# Patient Record
Sex: Female | Born: 1975 | Race: Black or African American | Hispanic: No | Marital: Single | Smoking: Current every day smoker
Health system: Southern US, Community
[De-identification: ages and names within clinical notes are randomized; demographics above are authoritative.]

## PROBLEM LIST (undated history)

## (undated) DIAGNOSIS — E663 Overweight: Secondary | ICD-10-CM

## (undated) DIAGNOSIS — Z923 Personal history of irradiation: Secondary | ICD-10-CM

## (undated) DIAGNOSIS — C801 Malignant (primary) neoplasm, unspecified: Secondary | ICD-10-CM

## (undated) DIAGNOSIS — I10 Essential (primary) hypertension: Secondary | ICD-10-CM

## (undated) DIAGNOSIS — C50911 Malignant neoplasm of unspecified site of right female breast: Secondary | ICD-10-CM

## (undated) DIAGNOSIS — F32A Depression, unspecified: Secondary | ICD-10-CM

## (undated) HISTORY — DX: Overweight: E66.3

## (undated) HISTORY — DX: Malignant neoplasm of unspecified site of right female breast: C50.911

## (undated) HISTORY — DX: Depression, unspecified: F32.A

## (undated) HISTORY — PX: BREAST LUMPECTOMY: SHX2

## (undated) HISTORY — DX: Essential (primary) hypertension: I10

---

## 2008-03-16 ENCOUNTER — Ambulatory Visit: Payer: Self-pay | Admitting: Diagnostic Radiology

## 2008-03-16 ENCOUNTER — Emergency Department (HOSPITAL_BASED_OUTPATIENT_CLINIC_OR_DEPARTMENT_OTHER): Admission: EM | Admit: 2008-03-16 | Discharge: 2008-03-16 | Payer: Self-pay | Admitting: Emergency Medicine

## 2008-12-19 ENCOUNTER — Emergency Department (HOSPITAL_BASED_OUTPATIENT_CLINIC_OR_DEPARTMENT_OTHER): Admission: EM | Admit: 2008-12-19 | Discharge: 2008-12-19 | Payer: Self-pay | Admitting: Emergency Medicine

## 2009-10-11 ENCOUNTER — Emergency Department (HOSPITAL_BASED_OUTPATIENT_CLINIC_OR_DEPARTMENT_OTHER): Admission: EM | Admit: 2009-10-11 | Discharge: 2009-10-11 | Payer: Self-pay | Admitting: Emergency Medicine

## 2010-09-14 ENCOUNTER — Encounter: Payer: Self-pay | Admitting: *Deleted

## 2010-09-14 ENCOUNTER — Emergency Department (HOSPITAL_BASED_OUTPATIENT_CLINIC_OR_DEPARTMENT_OTHER)
Admission: EM | Admit: 2010-09-14 | Discharge: 2010-09-14 | Disposition: A | Discharge status: Home / Self Care | Payer: Self-pay | Attending: Emergency Medicine | Admitting: Emergency Medicine

## 2010-09-14 DIAGNOSIS — F17200 Nicotine dependence, unspecified, uncomplicated: Secondary | ICD-10-CM | POA: Insufficient documentation

## 2010-09-14 DIAGNOSIS — J069 Acute upper respiratory infection, unspecified: Secondary | ICD-10-CM | POA: Insufficient documentation

## 2010-09-14 DIAGNOSIS — J029 Acute pharyngitis, unspecified: Secondary | ICD-10-CM | POA: Insufficient documentation

## 2010-09-14 DIAGNOSIS — R0982 Postnasal drip: Secondary | ICD-10-CM

## 2010-09-14 MED ORDER — OXYMETAZOLINE HCL 0.05 % NA SOLN
2.0000 | Freq: Once | NASAL | Status: AC
  Administered 2010-09-14: 2 via NASAL
  Filled 2010-09-14: qty 15

## 2010-09-14 MED ORDER — IBUPROFEN 800 MG PO TABS
800.0000 mg | ORAL_TABLET | Freq: Once | ORAL | Status: AC
  Administered 2010-09-14: 800 mg via ORAL
  Filled 2010-09-14: qty 1

## 2010-09-14 MED ORDER — BENZONATATE 100 MG PO CAPS
200.0000 mg | ORAL_CAPSULE | Freq: Once | ORAL | Status: AC
  Administered 2010-09-14: 200 mg via ORAL
  Filled 2010-09-14: qty 2

## 2010-09-14 MED ORDER — BENZONATATE 200 MG PO CAPS: 200.0000 mg | ORAL_CAPSULE | Freq: Once | ORAL | Status: AC

## 2010-09-14 MED ORDER — IBUPROFEN 800 MG PO TABS: 800.0000 mg | ORAL_TABLET | Freq: Once | ORAL | Status: AC

## 2010-09-14 NOTE — ED Notes (Signed)
C/o throat pain and her head "feels tight" and "pressure"

## 2010-09-14 NOTE — ED Provider Notes (Signed)
History     CSN: [id number] Arrival date & time: 09/14/2010 10:36 AM  Chief Complaint  Patient presents with  . Sore Throat   HPI Comments: Patient with no known sick contacts.  She has been experiencing mild headaches with pressure in her forehead area.  She denies nasal congestion.  She does endorse sensation of her ears popping and post-nasal drip.  She denies any fevers.  Patient is a 35 y.o. female presenting with pharyngitis. The history is provided by the patient. No language interpreter was used.  Sore Throat This is a new problem. The current episode started 2 days ago. The problem occurs constantly. The problem has been gradually worsening. Associated symptoms include headaches. The symptoms are aggravated by swallowing, eating, coughing and drinking. The symptoms are relieved by nothing. She has tried acetaminophen for the symptoms. The treatment provided no relief.    Past Medical History  Diagnosis Date  . Arthritis     History reviewed. No pertinent past surgical history.  History reviewed. No pertinent family history.  History  Substance Use Topics  . Smoking status: Current Everyday Smoker -- 1.0 packs/day  . Smokeless tobacco: Not on file  . Alcohol Use: Yes    OB History    Grav Para Term Preterm Abortions TAB SAB Ect Mult Living                  Review of Systems  Constitutional: Negative.   HENT: Positive for sore throat. Negative for congestion.   Eyes: Negative.   Respiratory: Positive for cough.   Cardiovascular: Negative.   Gastrointestinal: Negative.   Genitourinary: Negative.   Musculoskeletal: Positive for arthralgias.  Skin: Negative.   Neurological: Positive for headaches.  Hematological: Negative.   Psychiatric/Behavioral: Negative.   All other systems reviewed and are negative.    Physical Exam  BP 126/72  Pulse 68  Temp 98.6 F (37 C)  Resp 16  Ht 5\' 2"  (1.575 m)  Wt 152 lb (68.947 kg)  BMI 27.80 kg/m2  SpO2 99%  LMP  09/02/2010  Physical Exam  Nursing note and vitals reviewed. Constitutional: She is oriented to person, place, and time. She appears well-developed and well-nourished. No distress.  HENT:  Head: Normocephalic and atraumatic.  Right Ear: Tympanic membrane normal.  Left Ear: Tympanic membrane normal.  Nose: Mucosal edema present.  Mouth/Throat: Uvula is midline. Posterior oropharyngeal erythema present. No oropharyngeal exudate.  Eyes: Conjunctivae and EOM are normal. Pupils are equal, round, and reactive to light.  Neck: Normal range of motion.  Cardiovascular: Normal rate, regular rhythm, normal heart sounds and intact distal pulses.  Exam reveals no gallop and no friction rub.   No murmur heard. Pulmonary/Chest: Effort normal and breath sounds normal. No respiratory distress. She has no wheezes. She has no rales.  Abdominal: Soft. Bowel sounds are normal. She exhibits no distension. There is no tenderness. There is no rebound and no guarding.  Musculoskeletal: Normal range of motion.  Lymphadenopathy:    She has cervical adenopathy.  Neurological: She is alert and oriented to person, place, and time. No cranial nerve deficit. She exhibits normal muscle tone. Coordination normal.  Skin: Skin is warm and dry. No rash noted.  Psychiatric: She has a normal mood and affect.    ED Course  Procedures  MDM Patient had benign appearance and presentation consistent with viral URI vs. Allergic symptoms.  Patient received Afrin which improved her headache.  She was given tessalon perles for her cough and  advised to gargle with warm salt water for her sore throat.  PAtient stated understanding and was discharged home in good condition.  A: 35 yo F with mild URI symptoms.   Plan: As per MDM above.      Cyndra Numbers, MD 09/14/10 2053

## 2010-10-09 ENCOUNTER — Emergency Department (HOSPITAL_BASED_OUTPATIENT_CLINIC_OR_DEPARTMENT_OTHER)
Admission: EM | Admit: 2010-10-09 | Discharge: 2010-10-09 | Disposition: A | Discharge status: Home / Self Care | Payer: Self-pay | Attending: Emergency Medicine | Admitting: Emergency Medicine

## 2010-10-09 ENCOUNTER — Encounter (HOSPITAL_BASED_OUTPATIENT_CLINIC_OR_DEPARTMENT_OTHER): Payer: Self-pay | Admitting: *Deleted

## 2010-10-09 DIAGNOSIS — R51 Headache: Secondary | ICD-10-CM | POA: Insufficient documentation

## 2010-10-09 DIAGNOSIS — IMO0001 Reserved for inherently not codable concepts without codable children: Secondary | ICD-10-CM

## 2010-10-09 DIAGNOSIS — K219 Gastro-esophageal reflux disease without esophagitis: Secondary | ICD-10-CM | POA: Insufficient documentation

## 2010-10-09 DIAGNOSIS — J45909 Unspecified asthma, uncomplicated: Secondary | ICD-10-CM | POA: Insufficient documentation

## 2010-10-09 DIAGNOSIS — J029 Acute pharyngitis, unspecified: Secondary | ICD-10-CM | POA: Insufficient documentation

## 2010-10-09 LAB — RAPID STREP SCREEN (MED CTR MEBANE ONLY): Streptococcus, Group A Screen (Direct): NEGATIVE

## 2010-10-09 MED ORDER — METOCLOPRAMIDE HCL 5 MG/ML IJ SOLN
10.0000 mg | Freq: Once | INTRAMUSCULAR | Status: AC
  Administered 2010-10-09: 10 mg via INTRAMUSCULAR
  Filled 2010-10-09: qty 2

## 2010-10-09 MED ORDER — BUTALBITAL-APAP-CAFFEINE 50-325-40 MG PO TABS: 1.0000 | ORAL_TABLET | Freq: Four times a day (QID) | ORAL | Status: AC | PRN

## 2010-10-09 MED ORDER — IBUPROFEN 800 MG PO TABS
ORAL_TABLET | ORAL | Status: AC
  Filled 2010-10-09: qty 1

## 2010-10-09 MED ORDER — DIPHENHYDRAMINE HCL 50 MG PO CAPS
50.0000 mg | ORAL_CAPSULE | Freq: Once | ORAL | Status: AC
  Administered 2010-10-09: 50 mg via ORAL
  Filled 2010-10-09: qty 1

## 2010-10-09 MED ORDER — FAMOTIDINE 20 MG PO TABS: 20.0000 mg | ORAL_TABLET | Freq: Two times a day (BID) | ORAL | Status: DC

## 2010-10-09 MED ORDER — IBUPROFEN 800 MG PO TABS
800.0000 mg | ORAL_TABLET | Freq: Once | ORAL | Status: AC
  Administered 2010-10-09: 800 mg via ORAL

## 2010-10-09 MED ORDER — DIPHENHYDRAMINE HCL 25 MG PO CAPS
ORAL_CAPSULE | ORAL | Status: AC
  Administered 2010-10-09: 50 mg via ORAL
  Filled 2010-10-09: qty 2

## 2010-10-09 MED ORDER — GI COCKTAIL ~~LOC~~
30.0000 mL | Freq: Once | ORAL | Status: AC
  Administered 2010-10-09: 30 mL via ORAL
  Filled 2010-10-09: qty 30

## 2010-10-09 NOTE — ED Notes (Signed)
Pt states that she has no cold just a sore throat and headache. Pt states that she has some acid reflux issues.

## 2010-10-09 NOTE — ED Notes (Signed)
Facial pain, sore throat, and headache on and off x 1 week.

## 2010-10-12 NOTE — ED Provider Notes (Signed)
History     CSN: [id number] Arrival date & time: 10/09/2010  9:45 PM  Chief Complaint  Patient presents with  . URI    (Consider location/radiation/quality/duration/timing/severity/associated sxs/prior treatment) HPI Comments: Patient has had no rash.  She thinks her headache might be a migraine as it has persisted for days.  Patient has pain that is a 5-6/10.    URI  This is a recurrent problem. The current episode started yesterday. The problem has been unchanged. There has been no fever. Associated symptoms include congestion, headaches, a sore throat and swollen glands. She has tried acetaminophen (ASA) for the symptoms. The treatment provided no relief.    Past Medical History  Diagnosis Date  . Asthma     History reviewed. No pertinent past surgical history.  No family history on file.  History  Substance Use Topics  . Smoking status: Current Everyday Smoker -- 1.0 packs/day  . Smokeless tobacco: Not on file  . Alcohol Use: Yes    OB History    Grav Para Term Preterm Abortions TAB SAB Ect Mult Living                  Review of Systems  HENT: Positive for congestion and sore throat.   Neurological: Positive for headaches.  All other systems reviewed and are negative.    Allergies  Review of patient's allergies indicates no known allergies.  Home Medications   Current Outpatient Rx  Name Route Sig Dispense Refill  . RANITIDINE HCL 75 MG PO TABS Oral Take 75 mg by mouth 2 (two) times daily.      Marland Kitchen BUTALBITAL-APAP-CAFFEINE 50-325-40 MG PO TABS Oral Take 1-2 tablets by mouth every 6 (six) hours as needed for headache. 20 tablet 0  . FAMOTIDINE 20 MG PO TABS Oral Take 1 tablet (20 mg total) by mouth 2 (two) times daily. 30 tablet 0    BP 132/72  Pulse 74  Temp(Src) 98 F (36.7 C) (Oral)  Resp 17  SpO2 100%  LMP 09/02/2010  Physical Exam  Nursing note and vitals reviewed. Constitutional: She is oriented to person, place, and time. She appears  well-developed and well-nourished. No distress.  HENT:  Head: Normocephalic and atraumatic.  Right Ear: External ear normal.  Left Ear: External ear normal.  Mouth/Throat: Uvula is midline and mucous membranes are normal. Posterior oropharyngeal erythema present. No oropharyngeal exudate.  Eyes: Conjunctivae and EOM are normal. Pupils are equal, round, and reactive to light.  Neck: Normal range of motion.  Cardiovascular: Normal rate, regular rhythm, normal heart sounds and intact distal pulses.  Exam reveals no gallop and no friction rub.   No murmur heard. Pulmonary/Chest: Effort normal and breath sounds normal. No respiratory distress. She has no wheezes. She has no rales.  Abdominal: Soft. Bowel sounds are normal. She exhibits no distension. There is no tenderness. There is no rebound and no guarding.  Musculoskeletal: Normal range of motion. She exhibits no edema.  Lymphadenopathy:    She has cervical adenopathy.       Right cervical: Superficial cervical adenopathy present.       Left cervical: Superficial cervical adenopathy present.  Neurological: She is alert and oriented to person, place, and time. No cranial nerve deficit. She exhibits normal muscle tone. Coordination normal.  Skin: Skin is warm and dry. No rash noted. No erythema.  Psychiatric: She has a normal mood and affect.    ED Course  Procedures (including critical care time)   Labs Reviewed  RAPID STREP SCREEN   No results found.   1. Headache   2. Sore throat   3. Reflux       MDM  Patient was evaluated and was given ibuprofen initially.  She had persistent headache and IM shots of reglan and oral benadryl were given with some improvement.  Patient was also given a GI cocktail she also had complained of her sore throat being related to heartburn later in the visit.  Patient felt better.  She was given prescriptions for fioricet and pepcid.  She was encouraged to follow-up with a PCP and was discharged home  in good condition.      Cyndra Numbers, MD 10/12/10 [phone]

## 2018-01-07 DIAGNOSIS — C50911 Malignant neoplasm of unspecified site of right female breast: Secondary | ICD-10-CM

## 2018-01-07 DIAGNOSIS — C801 Malignant (primary) neoplasm, unspecified: Secondary | ICD-10-CM

## 2018-01-07 HISTORY — DX: Malignant (primary) neoplasm, unspecified: C80.1

## 2018-01-07 HISTORY — DX: Malignant neoplasm of unspecified site of right female breast: C50.911

## 2018-01-07 HISTORY — PX: BREAST LUMPECTOMY: SHX2

## 2018-02-19 DIAGNOSIS — K219 Gastro-esophageal reflux disease without esophagitis: Secondary | ICD-10-CM | POA: Insufficient documentation

## 2018-02-23 DIAGNOSIS — D0511 Intraductal carcinoma in situ of right breast: Secondary | ICD-10-CM | POA: Insufficient documentation

## 2018-04-01 DIAGNOSIS — F419 Anxiety disorder, unspecified: Secondary | ICD-10-CM | POA: Insufficient documentation

## 2018-04-01 DIAGNOSIS — F32A Depression, unspecified: Secondary | ICD-10-CM | POA: Insufficient documentation

## 2019-10-08 HISTORY — PX: CARPAL TUNNEL RELEASE: SHX101

## 2019-10-25 DIAGNOSIS — G5601 Carpal tunnel syndrome, right upper limb: Secondary | ICD-10-CM | POA: Insufficient documentation

## 2019-12-08 DIAGNOSIS — U071 COVID-19: Secondary | ICD-10-CM | POA: Insufficient documentation

## 2019-12-08 HISTORY — DX: COVID-19: U07.1

## 2019-12-29 ENCOUNTER — Encounter (HOSPITAL_BASED_OUTPATIENT_CLINIC_OR_DEPARTMENT_OTHER): Payer: Self-pay | Admitting: Emergency Medicine

## 2019-12-29 ENCOUNTER — Other Ambulatory Visit: Payer: Self-pay

## 2019-12-29 ENCOUNTER — Emergency Department (HOSPITAL_BASED_OUTPATIENT_CLINIC_OR_DEPARTMENT_OTHER)
Admission: EM | Admit: 2019-12-29 | Discharge: 2019-12-29 | Disposition: A | Discharge status: Home / Self Care | Payer: HRSA Program | Attending: Emergency Medicine | Admitting: Emergency Medicine

## 2019-12-29 DIAGNOSIS — M791 Myalgia, unspecified site: Secondary | ICD-10-CM | POA: Insufficient documentation

## 2019-12-29 DIAGNOSIS — F17200 Nicotine dependence, unspecified, uncomplicated: Secondary | ICD-10-CM | POA: Insufficient documentation

## 2019-12-29 DIAGNOSIS — U071 COVID-19: Secondary | ICD-10-CM | POA: Insufficient documentation

## 2019-12-29 DIAGNOSIS — J45909 Unspecified asthma, uncomplicated: Secondary | ICD-10-CM | POA: Diagnosis not present

## 2019-12-29 DIAGNOSIS — Z859 Personal history of malignant neoplasm, unspecified: Secondary | ICD-10-CM | POA: Diagnosis not present

## 2019-12-29 DIAGNOSIS — R070 Pain in throat: Secondary | ICD-10-CM | POA: Diagnosis present

## 2019-12-29 DIAGNOSIS — B349 Viral infection, unspecified: Secondary | ICD-10-CM

## 2019-12-29 HISTORY — DX: Malignant (primary) neoplasm, unspecified: C80.1

## 2019-12-29 LAB — RESP PANEL BY RT-PCR (FLU A&B, COVID) ARPGX2
Influenza A by PCR: NEGATIVE
Influenza B by PCR: NEGATIVE
SARS Coronavirus 2 by RT PCR: POSITIVE — AB

## 2019-12-29 MED ORDER — IBUPROFEN 400 MG PO TABS
600.0000 mg | ORAL_TABLET | Freq: Once | ORAL | Status: AC
  Administered 2019-12-29: 600 mg via ORAL
  Filled 2019-12-29: qty 1

## 2019-12-29 NOTE — ED Provider Notes (Signed)
[facility] EMER[facility]ENC[facility] DEPARTMENT Provider Note   CSN: [id number] Arrival date & time: 12/29/19  1925     History Chief Complaint  Patient presents with   Generalized Body Aches    Evelyn Cantrell is a 44 y.o. female.  HPI 44 year old female presents with body aches.  Yesterday started noticing bilateral thigh aching as well as in her hands.  No fevers and no other signs of illness such as cough, rhinorrhea, sore throat, shortness of breath, vomiting, diarrhea.  Has a mild headache.  Took some TheraFlu.  Has been vaccinated versus COVID.  Her son has similar symptoms but also a sore throat. No discolored urine. Is not on a statin.   Past Medical History:  Diagnosis Date   Asthma    Cancer ([place])     There are no problems to display for this patient.   History reviewed. No pertinent surgical history.   OB History   No obstetric history on file.     No family history on file.  Social History   Tobacco Use   Smoking status: Current Every Day Smoker    Packs/day: 1.00  Substance Use Topics   Alcohol use: Yes   Drug use: No    Home Medications Prior to Admission medications   Medication Sig Start Date End Date Taking? Authorizing Provider  tamoxifen (NOLVADEX) 20 MG tablet  10/10/19  Yes [provider]  famotidine (PEPCID) 20 MG tablet Take 1 tablet (20 mg total) by mouth 2 (two) times daily. 10/09/10 10/09/11  Chauncy Passy, MD  ranitidine (ZANTAC) 75 MG tablet Take 75 mg by mouth 2 (two) times daily.      [provider]    Allergies    Patient has no known allergies.  Review of Systems   Review of Systems  Constitutional: Negative for fever.  HENT: Negative for congestion, rhinorrhea and sore throat.   Respiratory: Negative for cough and shortness of breath.   Gastrointestinal: Negative for diarrhea and vomiting.  Genitourinary: Negative for dysuria.  Musculoskeletal: Positive for myalgias.  Neurological: Positive for  headaches.  All other systems reviewed and are negative.   Physical Exam Updated Vital Signs BP (!) 125/93    Pulse (!) 108    Temp 99.1 F (37.3 C) (Oral)    Resp 16    Ht 5\' 2"  (1.575 m)    Wt 67.1 kg    SpO2 96%    BMI 27.07 kg/m   Physical Exam Vitals and nursing note reviewed.  Constitutional:      General: She is not in acute distress.    Appearance: She is well-developed and well-nourished. She is not ill-appearing or diaphoretic.  HENT:     Head: Normocephalic and atraumatic.     Right Ear: External ear normal.     Left Ear: External ear normal.     Nose: Nose normal.  Eyes:     General:        Right eye: No discharge.        Left eye: No discharge.  Cardiovascular:     Rate and Rhythm: Normal rate and regular rhythm.     Heart sounds: Normal heart sounds.  Pulmonary:     Effort: Pulmonary effort is normal.     Breath sounds: Normal breath sounds.  Abdominal:     Palpations: Abdomen is soft.     Tenderness: There is no abdominal tenderness.  Musculoskeletal:     Comments: No muscle tenderness in large muscle  groups like quadriceps  Skin:    General: Skin is warm and dry.  Neurological:     Mental Status: She is alert.  Psychiatric:        Mood and Affect: Mood is not anxious.     ED Results / Procedures / Treatments   Labs (all labs ordered are listed, but only abnormal results are displayed) Labs Reviewed  RESP PANEL BY RT-PCR (FLU A&B, COVID) ARPGX2    EKG None  Radiology No results found.  Procedures Procedures (including critical care time)  Medications Ordered in ED Medications  ibuprofen (ADVIL) tablet 600 mg (has no administration in time range)    ED Course  I have reviewed the triage vital signs and the nursing notes.  Pertinent labs & imaging results that were available during my care of the patient were reviewed by me and considered in my medical decision making (see chart for details).    MDM Rules/Calculators/A&P                           With her son also feeling poorly I suspect this is a viral illness.  Will test for Covid, which was ordered and swabbed in triage.  However given her well appearance she does not appear to need to stay for the results and can follow-up in my chart.  Highly doubt bacterial illness at this time.  Highly doubt rhabdomyolysis.  Discharged home with NSAIDs, Tylenol, return precautions.  Evelyn Cantrell was evaluated in Emergency Department on 12/29/2019 for the symptoms described in the history of present illness. She was evaluated in the context of the global COVID-19 pandemic, which necessitated consideration that the patient might be at risk for infection with the SARS-CoV-2 virus that causes COVID-19. Institutional protocols and algorithms that pertain to the evaluation of patients at risk for COVID-19 are in a state of rapid change based on information released by regulatory bodies including the CDC and federal and state organizations. These policies and algorithms were followed during the patient's care in the ED.  Final Clinical Impression(s) / ED Diagnoses Final diagnoses:  Myalgia  Viral syndrome    Rx / DC Orders ED Discharge Orders    None       Sherwood Gambler, MD 12/29/19 2029

## 2019-12-29 NOTE — Discharge Instructions (Signed)
If you develop high fever, severe cough or cough with blood, trouble breathing, severe headache, neck pain/stiffness, vomiting, or any other new/concerning symptoms then return to the ER for evaluation  

## 2019-12-29 NOTE — ED Triage Notes (Signed)
pt c/o generalized boyd aches since yesterday denies any URI sympotms.

## 2019-12-30 ENCOUNTER — Encounter: Payer: Self-pay | Admitting: Nurse Practitioner

## 2019-12-30 ENCOUNTER — Telehealth: Payer: Self-pay | Admitting: Nurse Practitioner

## 2019-12-30 DIAGNOSIS — C801 Malignant (primary) neoplasm, unspecified: Secondary | ICD-10-CM | POA: Insufficient documentation

## 2019-12-30 DIAGNOSIS — E663 Overweight: Secondary | ICD-10-CM | POA: Insufficient documentation

## 2019-12-30 DIAGNOSIS — J45909 Unspecified asthma, uncomplicated: Secondary | ICD-10-CM | POA: Insufficient documentation

## 2019-12-30 NOTE — Telephone Encounter (Signed)
I called Evelyn Cantrell to discuss Covid symptoms and the use of Sotrovimab, a monoclonal antibody infusion for those with mild to moderate Covid symptoms and at a high risk of hospitalization.     Pt is fully vaccinated with limited symptoms and therefor will not qualify for mAb given current supply limitations.  Symptoms reviewed that would warrant ED/Hospital evaluation. Preventative practices reviewed. Patient verbalized understanding. Patient advised to call back if he decides that he does want to get infusion. Callback number to the infusion center given. Patient advised to go to Urgent care or ED with severe symptoms.    Patient Active Problem List   Diagnosis Date Noted  . Cancer ([place])   . Asthma   . Overweight (BMI 25.0-29.9)   . COVID-19 virus infection 12/2019    Murray Hodgkins, NP

## 2020-02-18 LAB — RESULTS CONSOLE HPV: CHL HPV: NEGATIVE

## 2020-02-18 LAB — HM PAP SMEAR

## 2020-08-15 ENCOUNTER — Emergency Department (HOSPITAL_BASED_OUTPATIENT_CLINIC_OR_DEPARTMENT_OTHER)
Admission: EM | Admit: 2020-08-15 | Discharge: 2020-08-15 | Disposition: A | Discharge status: Home / Self Care | Payer: Medicaid Other | Attending: Emergency Medicine | Admitting: Emergency Medicine

## 2020-08-15 ENCOUNTER — Emergency Department (HOSPITAL_BASED_OUTPATIENT_CLINIC_OR_DEPARTMENT_OTHER): Payer: Medicaid Other

## 2020-08-15 ENCOUNTER — Other Ambulatory Visit: Payer: Self-pay

## 2020-08-15 ENCOUNTER — Encounter (HOSPITAL_BASED_OUTPATIENT_CLINIC_OR_DEPARTMENT_OTHER): Payer: Self-pay | Admitting: *Deleted

## 2020-08-15 DIAGNOSIS — Z8616 Personal history of COVID-19: Secondary | ICD-10-CM | POA: Insufficient documentation

## 2020-08-15 DIAGNOSIS — F17210 Nicotine dependence, cigarettes, uncomplicated: Secondary | ICD-10-CM | POA: Insufficient documentation

## 2020-08-15 DIAGNOSIS — M5413 Radiculopathy, cervicothoracic region: Secondary | ICD-10-CM

## 2020-08-15 DIAGNOSIS — Z859 Personal history of malignant neoplasm, unspecified: Secondary | ICD-10-CM | POA: Insufficient documentation

## 2020-08-15 DIAGNOSIS — M5412 Radiculopathy, cervical region: Secondary | ICD-10-CM | POA: Insufficient documentation

## 2020-08-15 MED ORDER — METHOCARBAMOL 500 MG PO TABS: 500.0000 mg | ORAL_TABLET | Freq: Two times a day (BID) | ORAL | 0 refills | Status: DC

## 2020-08-15 MED ORDER — LIDOCAINE 5 % EX PTCH
1.0000 | MEDICATED_PATCH | CUTANEOUS | Status: DC
  Administered 2020-08-15: 1 via TRANSDERMAL
  Filled 2020-08-15: qty 1

## 2020-08-15 NOTE — ED Triage Notes (Signed)
Right shoulder pain x 3 weeks. No known injury. Started after lifting at work.

## 2020-08-15 NOTE — ED Notes (Signed)
Pt. Reports R shoulder pain hurting for a long time and burning for 3 weeks.  Pt. In no distress and reports no shob.  Pt.is able to move the R arm and has no trouble with chest distress or cough.  History of no injury.

## 2020-08-15 NOTE — ED Provider Notes (Signed)
[facility] EM[facility] DEPART[facility] Provider Note   CSN: [id number] Arrival date & time: 08/15/20  1607     History Chief Complaint  Patient presents with   Shoulder Pain    Evelyn Cantrell is a 45 y.o. female.  45 year old female with complaint of a burning pain in her right shoulder/medial boarder of the scapula for the past few weeks. Pain lasts a few seconds and completely resolved between episodes. Nothing makes pain better or worse. Reports prior breast cancer with mastectomy 2 years ago, no ongoing treatment.       Past Medical History:  Diagnosis Date   Asthma    Cancer ([place])    COVID-19 virus infection 12/2019   Overweight (BMI 25.0-29.9)     Patient Active Problem List   Diagnosis Date Noted   Cancer ([place])    Asthma    Overweight (BMI 25.0-29.9)    COVID-19 virus infection 12/2019    History reviewed. No pertinent surgical history.   OB History   No obstetric history on file.     No family history on file.  Social History   Tobacco Use   Smoking status: Every Day    Packs/day: 1.00    Types: Cigarettes   Smokeless tobacco: Never  Vaping Use   Vaping Use: Never used  Substance Use Topics   Alcohol use: Yes   Drug use: No    Home Medications Prior to Admission medications   Medication Sig Start Date End Date Taking? Authorizing Provider  methocarbamol (ROBAXIN) 500 MG tablet Take 1 tablet (500 mg total) by mouth 2 (two) times daily. 08/15/20  Yes Tacy Learn, PA-C  tamoxifen (NOLVADEX) 20 MG tablet  10/10/19  Yes [provider]  famotidine (PEPCID) 20 MG tablet Take 1 tablet (20 mg total) by mouth 2 (two) times daily. 10/09/10 10/09/11  Chauncy Passy, MD  ranitidine (ZANTAC) 75 MG tablet Take 75 mg by mouth 2 (two) times daily.      [provider]    Allergies    Patient has no known allergies.  Review of Systems   Review of Systems  Constitutional:  Negative for fever.  Musculoskeletal:  Positive for back pain.  Negative for neck pain and neck stiffness.  Skin:  Negative for rash and wound.  Neurological:  Negative for weakness and numbness.  Hematological:  Negative for adenopathy.  Psychiatric/Behavioral:  Negative for confusion.    Physical Exam Updated Vital Signs BP 140/90   Pulse 65   Temp 98.8 F (37.1 C) (Oral)   Resp 18   Ht '5\' 2"'$  (1.575 m)   Wt 67.1 kg   SpO2 100%   BMI 27.06 kg/m   Physical Exam Vitals and nursing note reviewed.  Constitutional:      General: She is not in acute distress.    Appearance: She is well-developed. She is not diaphoretic.  HENT:     Head: Normocephalic and atraumatic.  Cardiovascular:     Rate and Rhythm: Normal rate and regular rhythm.     Pulses: Normal pulses.     Heart sounds: Normal heart sounds.  Pulmonary:     Effort: Pulmonary effort is normal.     Breath sounds: Normal breath sounds.  Musculoskeletal:        General: Tenderness present. No swelling or deformity. Normal range of motion.     Comments: Ttp right trapezius into medial border right scapula with palpable spasm   Skin:    General:  Skin is warm and dry.     Findings: No erythema or rash.  Neurological:     Mental Status: She is alert and oriented to person, place, and time.     Sensory: No sensory deficit.     Motor: No weakness.  Psychiatric:        Behavior: Behavior normal.    ED Results / Procedures / Treatments   Labs (all labs ordered are listed, but only abnormal results are displayed) Labs Reviewed - No data to display  EKG None  Radiology DG Shoulder Right  Result Date: 08/15/2020 CLINICAL DATA:  Shoulder pain EXAM: RIGHT SHOULDER - 2+ VIEW COMPARISON:  None. FINDINGS: There is no evidence of fracture or dislocation. There is no evidence of arthropathy or other focal bone abnormality. Soft tissues are unremarkable. IMPRESSION: Negative. Electronically Signed   By: Donavan Foil M.D.   On: 08/15/2020 18:53    Procedures Procedures   Medications  Ordered in ED Medications  lidocaine (LIDODERM) 5 % 1 patch (1 patch Transdermal Patch Applied 08/15/20 1955)    ED Course  I have reviewed the triage vital signs and the nursing notes.  Pertinent labs & imaging results that were available during my care of the patient were reviewed by me and considered in my medical decision making (see chart for details).  Clinical Course as of 08/15/20 2023  Tue Aug 15, 4749  6211 45 year old female with burning sensation along medial border right scapula for the past few weeks. Right shoulder pain for the past year (prior work up MRI cervical spine and shoulder eval unremarkable 1 year ago).  Found to have palpable muscle spasm right trapezius which reproduces pain. Plan is for muscle relaxant, warm compresses, gentle stretching and recheck with PCP.  [LM]  2023 XR shoulder unremarkable.  [LM]    Clinical Course User Index [LM] Roque Lias   MDM Rules/Calculators/A&P                           Final Clinical Impression(s) / ED Diagnoses Final diagnoses:  Radiculopathy of cervicothoracic region    Rx / DC Orders ED Discharge Orders          Ordered    methocarbamol (ROBAXIN) 500 MG tablet  2 times daily        08/15/20 1948             Tacy Learn, PA-C 08/15/20 2023    Isla Pence, MD 08/15/20 2121

## 2020-08-15 NOTE — Discharge Instructions (Addendum)
Take Robaxin as prescribed as needed.  Recommend warm compresses followed by gentle stretching. Monitor your blood pressure, record readings, follow-up with your primary care provider.

## 2020-09-24 ENCOUNTER — Emergency Department (HOSPITAL_BASED_OUTPATIENT_CLINIC_OR_DEPARTMENT_OTHER): Payer: Self-pay

## 2020-09-24 ENCOUNTER — Other Ambulatory Visit: Payer: Self-pay

## 2020-09-24 ENCOUNTER — Emergency Department (HOSPITAL_BASED_OUTPATIENT_CLINIC_OR_DEPARTMENT_OTHER)
Admission: EM | Admit: 2020-09-24 | Discharge: 2020-09-24 | Disposition: A | Discharge status: Home / Self Care | Payer: Self-pay | Attending: Emergency Medicine | Admitting: Emergency Medicine

## 2020-09-24 ENCOUNTER — Encounter (HOSPITAL_BASED_OUTPATIENT_CLINIC_OR_DEPARTMENT_OTHER): Payer: Self-pay | Admitting: *Deleted

## 2020-09-24 DIAGNOSIS — Z79810 Long term (current) use of selective estrogen receptor modulators (SERMs): Secondary | ICD-10-CM | POA: Insufficient documentation

## 2020-09-24 DIAGNOSIS — R42 Dizziness and giddiness: Secondary | ICD-10-CM | POA: Insufficient documentation

## 2020-09-24 DIAGNOSIS — F17210 Nicotine dependence, cigarettes, uncomplicated: Secondary | ICD-10-CM | POA: Insufficient documentation

## 2020-09-24 DIAGNOSIS — R079 Chest pain, unspecified: Secondary | ICD-10-CM | POA: Insufficient documentation

## 2020-09-24 DIAGNOSIS — Z8616 Personal history of COVID-19: Secondary | ICD-10-CM | POA: Insufficient documentation

## 2020-09-24 DIAGNOSIS — J45909 Unspecified asthma, uncomplicated: Secondary | ICD-10-CM | POA: Insufficient documentation

## 2020-09-24 DIAGNOSIS — Z853 Personal history of malignant neoplasm of breast: Secondary | ICD-10-CM | POA: Insufficient documentation

## 2020-09-24 LAB — CBC
HCT: 42.9 % (ref 36.0–46.0)
Hemoglobin: 14.4 g/dL (ref 12.0–15.0)
MCH: 32.4 pg (ref 26.0–34.0)
MCHC: 33.6 g/dL (ref 30.0–36.0)
MCV: 96.6 fL (ref 80.0–100.0)
Platelets: 224 10*3/uL (ref 150–400)
RBC: 4.44 MIL/uL (ref 3.87–5.11)
RDW: 14.2 % (ref 11.5–15.5)
WBC: 5.7 10*3/uL (ref 4.0–10.5)
nRBC: 0 % (ref 0.0–0.2)

## 2020-09-24 LAB — BASIC METABOLIC PANEL
Anion gap: 7 (ref 5–15)
BUN: 7 mg/dL (ref 6–20)
CO2: 28 mmol/L (ref 22–32)
Calcium: 9.3 mg/dL (ref 8.9–10.3)
Chloride: 104 mmol/L (ref 98–111)
Creatinine, Ser: 0.93 mg/dL (ref 0.44–1.00)
GFR, Estimated: >60 mL/min (ref >60)
Glucose, Bld: 93 mg/dL (ref 70–99)
Potassium: 3.9 mmol/L (ref 3.5–5.1)
Sodium: 139 mmol/L (ref 135–145)

## 2020-09-24 LAB — PREGNANCY, URINE: Preg Test, Ur: NEGATIVE

## 2020-09-24 LAB — TROPONIN I (HIGH SENSITIVITY): Troponin I (High Sensitivity): 4 ng/L (ref <18)

## 2020-09-24 NOTE — ED Triage Notes (Signed)
Intermittent left chest pain and dizziness for a week.

## 2020-09-24 NOTE — Discharge Instructions (Addendum)
Found no emergent cause of chest pain while you are in the emergency department.  Your chest pain could be caused by acid reflux, musculoskeletal pain, or other unknown cause.  You should follow-up with [facility].  Please return to the emergency department if you develop worsening symptoms such as shortness of breath, leg swelling, or worsening chest pain.

## 2020-09-24 NOTE — ED Provider Notes (Signed)
[facility] EMERGENCY DEPARTMENT Provider Note   CSN: [id number] Arrival date & time: 09/24/20  1727     History Chief Complaint  Patient presents with   Dizziness   Chest Pain    Evelyn Cantrell is a 45 y.o. female.  Medical history of asthma and history of breast cancer that was treated with lumpectomy and 2020 currently on tamoxifen, and tobacco use.  Presents with intermittent left-sided chest pain for the past week.  Describes it as pressure.  It does not radiate.  Episodes last about a minute and then improved.  Is nonpleuritic.  It starts at random times no associated soon with rest or exertion.  This is never happened before.  She has not tried anything to help the pain.  She denies injury to the area.  She denies any syncope, shortness of breath, nausea, vomiting, leg swelling.  The history is provided by the patient.  Dizziness Associated symptoms: chest pain   Chest Pain Pain location:  L chest Pain quality: tightness   Pain quality: not radiating   Pain radiates to:  Does not radiate Pain severity:  Moderate Onset quality:  Sudden Duration:  6 days Timing:  Intermittent Progression:  Unchanged Chronicity:  New Context: at rest   Context: not breathing, not drug use, not eating, not movement and not trauma   Relieved by:  Nothing Worsened by:  Nothing Ineffective treatments:  None tried     Past Medical History:  Diagnosis Date   Asthma    Cancer ([place])    COVID-19 virus infection 12/2019   Overweight (BMI 25.0-29.9)     Patient Active Problem List   Diagnosis Date Noted   Cancer ([place])    Asthma    Overweight (BMI 25.0-29.9)    COVID-19 virus infection 12/2019    History reviewed. No pertinent surgical history.   OB History   No obstetric history on file.     History reviewed. No pertinent family history.  Social History   Tobacco Use   Smoking status: Every Day    Packs/day: 1.00    Types: Cigarettes   Smokeless tobacco: Never   Vaping Use   Vaping Use: Never used  Substance Use Topics   Alcohol use: Yes   Drug use: No    Home Medications Prior to Admission medications   Medication Sig Start Date End Date Taking? Authorizing Provider  tamoxifen (NOLVADEX) 20 MG tablet  10/10/19  Yes [provider]    Allergies    Patient has no known allergies.  Review of Systems   Review of Systems  Cardiovascular:  Positive for chest pain.   Physical Exam Updated Vital Signs BP (!) 125/91 (BP Location: Left Arm)   Pulse 75   Temp 98.5 F (36.9 C) (Oral)   Resp 18   Ht '5\' 2"'$  (1.575 m)   Wt 64.4 kg   SpO2 100%   BMI 25.97 kg/m   Physical Exam  ED Results / Procedures / Treatments   Labs (all labs ordered are listed, but only abnormal results are displayed) Labs Reviewed  BASIC METABOLIC PANEL  CBC  PREGNANCY, URINE  TROPONIN I (HIGH SENSITIVITY)  TROPONIN I (HIGH SENSITIVITY)    EKG EKG Interpretation  Date/Time:  Sunday September 24 2020 17:48:37 EDT Ventricular Rate:  83 PR Interval:  176 QRS Duration: 68 QT Interval:  354 QTC Calculation: 415 R Axis:   72 Text Interpretation: Normal sinus rhythm Biatrial enlargement No previous tracing Confirmed by Maryan Rued,  [place] [id number]) on 09/24/2020 6:20:01 PM  Radiology DG Chest 2 View  Result Date: 09/24/2020 CLINICAL DATA:  Chest pain EXAM: CHEST - 2 VIEW COMPARISON:  03/16/2008 FINDINGS: The heart size and mediastinal contours are within normal limits. Both lungs are clear. The visualized skeletal structures are unremarkable. IMPRESSION: No active cardiopulmonary disease. Electronically Signed   By: Merilyn Baba M.D.   On: 09/24/2020 19:02    Procedures Procedures   Medications Ordered in ED Medications - No data to display  ED Course  I have reviewed the triage vital signs and the nursing notes.  Pertinent labs & imaging results that were available during my care of the patient were reviewed by me and considered in my medical  decision making (see chart for details).    MDM Rules/Calculators/A&P                         Is a well-appearing 45 year old female presents to the emergency department with complaints of chest pain.  Pain is described to be intermittent and for about 1 week.  She has no significant cardiac history.  She is afebrile and vital signs are overall unrevealing.  Chest pain described is atypical for ACS.  MP with no electrolyte disturbances or kidney dysfunction.  CBC with no anemia or leukocytosis.  Troponin is 4.  Chest x-ray with no acute abnormalities. Heart score is 1 PERC 1 WELLS = low risk No indication for CT chest due to low risk Wells score  Patient was informed of these results and findings.  She is recommended to get established at [facility].  She is instructed to return to the emergency department if she develops any worsening symptoms.  Final Clinical Impression(s) / ED Diagnoses Final diagnoses:  Chest pain, unspecified type    Rx / DC Orders ED Discharge Orders     None        Adolphus Birchwood, PA-C 09/25/20 0012    Blanchie Dessert, MD 09/26/20 1339

## 2020-09-24 NOTE — ED Notes (Signed)
IV attempt left AC unsuccessful, patient tolerated well, blood obtained and sent to lab.

## 2020-10-13 DIAGNOSIS — E663 Overweight: Secondary | ICD-10-CM | POA: Diagnosis not present

## 2020-10-13 DIAGNOSIS — Z01419 Encounter for gynecological examination (general) (routine) without abnormal findings: Secondary | ICD-10-CM | POA: Diagnosis not present

## 2020-10-13 DIAGNOSIS — Z113 Encounter for screening for infections with a predominantly sexual mode of transmission: Secondary | ICD-10-CM | POA: Diagnosis not present

## 2020-10-13 DIAGNOSIS — Z1388 Encounter for screening for disorder due to exposure to contaminants: Secondary | ICD-10-CM | POA: Diagnosis not present

## 2020-10-13 DIAGNOSIS — Z0389 Encounter for observation for other suspected diseases and conditions ruled out: Secondary | ICD-10-CM | POA: Diagnosis not present

## 2020-10-13 DIAGNOSIS — Z853 Personal history of malignant neoplasm of breast: Secondary | ICD-10-CM | POA: Diagnosis not present

## 2020-10-13 DIAGNOSIS — Z3009 Encounter for other general counseling and advice on contraception: Secondary | ICD-10-CM | POA: Diagnosis not present

## 2020-11-21 ENCOUNTER — Emergency Department (HOSPITAL_BASED_OUTPATIENT_CLINIC_OR_DEPARTMENT_OTHER)
Admission: EM | Admit: 2020-11-21 | Discharge: 2020-11-22 | Disposition: A | Discharge status: Home / Self Care | Payer: Self-pay | Attending: Emergency Medicine | Admitting: Emergency Medicine

## 2020-11-21 ENCOUNTER — Other Ambulatory Visit: Payer: Self-pay

## 2020-11-21 ENCOUNTER — Emergency Department (HOSPITAL_BASED_OUTPATIENT_CLINIC_OR_DEPARTMENT_OTHER): Payer: Self-pay

## 2020-11-21 ENCOUNTER — Encounter (HOSPITAL_BASED_OUTPATIENT_CLINIC_OR_DEPARTMENT_OTHER): Payer: Self-pay | Admitting: Emergency Medicine

## 2020-11-21 DIAGNOSIS — M79602 Pain in left arm: Secondary | ICD-10-CM | POA: Insufficient documentation

## 2020-11-21 DIAGNOSIS — R072 Precordial pain: Secondary | ICD-10-CM | POA: Insufficient documentation

## 2020-11-21 DIAGNOSIS — R0602 Shortness of breath: Secondary | ICD-10-CM | POA: Insufficient documentation

## 2020-11-21 DIAGNOSIS — F17210 Nicotine dependence, cigarettes, uncomplicated: Secondary | ICD-10-CM | POA: Insufficient documentation

## 2020-11-21 DIAGNOSIS — Z8616 Personal history of COVID-19: Secondary | ICD-10-CM | POA: Insufficient documentation

## 2020-11-21 DIAGNOSIS — J45909 Unspecified asthma, uncomplicated: Secondary | ICD-10-CM | POA: Insufficient documentation

## 2020-11-21 DIAGNOSIS — R42 Dizziness and giddiness: Secondary | ICD-10-CM | POA: Insufficient documentation

## 2020-11-21 DIAGNOSIS — Z853 Personal history of malignant neoplasm of breast: Secondary | ICD-10-CM | POA: Insufficient documentation

## 2020-11-21 LAB — CBC
HCT: 39.9 % (ref 36.0–46.0)
Hemoglobin: 13.5 g/dL (ref 12.0–15.0)
MCH: 32 pg (ref 26.0–34.0)
MCHC: 33.8 g/dL (ref 30.0–36.0)
MCV: 94.5 fL (ref 80.0–100.0)
Platelets: 210 10*3/uL (ref 150–400)
RBC: 4.22 MIL/uL (ref 3.87–5.11)
RDW: 13.6 % (ref 11.5–15.5)
WBC: 7.8 10*3/uL (ref 4.0–10.5)
nRBC: 0 % (ref 0.0–0.2)

## 2020-11-21 LAB — BASIC METABOLIC PANEL
Anion gap: 8 (ref 5–15)
BUN: 7 mg/dL (ref 6–20)
CO2: 24 mmol/L (ref 22–32)
Calcium: 8.9 mg/dL (ref 8.9–10.3)
Chloride: 103 mmol/L (ref 98–111)
Creatinine, Ser: 0.82 mg/dL (ref 0.44–1.00)
GFR, Estimated: >60 mL/min (ref >60)
Glucose, Bld: 95 mg/dL (ref 70–99)
Potassium: 3.4 mmol/L — ABNORMAL LOW (ref 3.5–5.1)
Sodium: 135 mmol/L (ref 135–145)

## 2020-11-21 LAB — TROPONIN I (HIGH SENSITIVITY): Troponin I (High Sensitivity): <2 ng/L (ref <18)

## 2020-11-21 LAB — PREGNANCY, URINE: Preg Test, Ur: NEGATIVE

## 2020-11-21 NOTE — ED Triage Notes (Signed)
Pt states was in bed and started having left arm pain followed by chest pain and SOB about 1 hour ago

## 2020-11-21 NOTE — ED Provider Notes (Signed)
[facility] EMERGENCY DEPARTMENT Provider Note   CSN: [id number] Arrival date & time: 11/21/20  2027     History Chief Complaint  Patient presents with   Arm Pain   Chest Pain   Shortness of Breath    Evelyn Cantrell is a 45 y.o. female.  HPI     Was home in bed, had a pain in the left arm, then had chest pain and shortness of breath  Started right before arrival 8PM Feeling a little better right now Lasted about 2 hours Sharp pain, worse with deep breaths , not worse with exertion Nausea, no vomiting No fevers, cough, abdominal pain Lightheaded all day today, no syncope  Uncle who had a heart attack, when older than 55, no immediate fam hx  Smoking cigarettes, no etoh or other drugs  Hx of breast cancer, lumpectomy, on tamoxifen, asthma hx, not wheezing   No leg pain or swelling, no hx of DVT or PE  Past Medical History:  Diagnosis Date   Asthma    Cancer ([place])    COVID-19 virus infection 12/2019   Overweight (BMI 25.0-29.9)     Patient Active Problem List   Diagnosis Date Noted   Cancer ([place])    Asthma    Overweight (BMI 25.0-29.9)    COVID-19 virus infection 12/2019    Past Surgical History:  Procedure Laterality Date   BREAST LUMPECTOMY Right      OB History   No obstetric history on file.     History reviewed. No pertinent family history.  Social History   Tobacco Use   Smoking status: Every Day    Packs/day: 1.00    Types: Cigarettes   Smokeless tobacco: Never  Vaping Use   Vaping Use: Never used  Substance Use Topics   Alcohol use: Yes   Drug use: No    Home Medications Prior to Admission medications   Medication Sig Start Date End Date Taking? Authorizing Provider  tamoxifen (NOLVADEX) 20 MG tablet  10/10/19   [provider]    Allergies    Patient has no known allergies.  Review of Systems   Review of Systems  Constitutional:  Negative for fever.  HENT:  Negative for sore throat.   Eyes:  Negative  for visual disturbance.  Respiratory:  Positive for shortness of breath. Negative for cough.   Cardiovascular:  Positive for chest pain.  Gastrointestinal:  Negative for abdominal pain.  Genitourinary:  Negative for difficulty urinating.  Musculoskeletal:  Negative for back pain and neck pain.  Skin:  Negative for rash.  Neurological:  Positive for light-headedness. Negative for syncope and headaches.   Physical Exam Updated Vital Signs BP 115/77   Pulse 82   Temp 98.2 F (36.8 C) (Oral)   Resp 16   Ht 5\' 2"  (1.575 m)   Wt 66.2 kg   LMP 08/13/2020   SpO2 100%   BMI 26.70 kg/m   Physical Exam Vitals and nursing note reviewed.  Constitutional:      General: She is not in acute distress.    Appearance: She is well-developed. She is not diaphoretic.  HENT:     Head: Normocephalic and atraumatic.  Eyes:     Conjunctiva/sclera: Conjunctivae normal.  Cardiovascular:     Rate and Rhythm: Normal rate and regular rhythm.     Heart sounds: Normal heart sounds. No murmur heard.   No friction rub. No gallop.  Pulmonary:     Effort: Pulmonary effort is normal.  No respiratory distress.     Breath sounds: Normal breath sounds. No wheezing or rales.  Abdominal:     General: There is no distension.     Palpations: Abdomen is soft.     Tenderness: There is no abdominal tenderness. There is no guarding.  Musculoskeletal:        General: No tenderness.     Cervical back: Normal range of motion.  Skin:    General: Skin is warm and dry.     Findings: No erythema or rash.  Neurological:     Mental Status: She is alert and oriented to person, place, and time.    ED Results / Procedures / Treatments   Labs (all labs ordered are listed, but only abnormal results are displayed) Labs Reviewed  BASIC METABOLIC PANEL - Abnormal; Notable for the following components:      Result Value   Potassium 3.4 (*)    All other components within normal limits  CBC  PREGNANCY, URINE  D-DIMER,  QUANTITATIVE  TROPONIN I (HIGH SENSITIVITY)  TROPONIN I (HIGH SENSITIVITY)    EKG EKG Interpretation  Date/Time:  Tuesday November 21 2020 20:40:29 EST Ventricular Rate:  100 PR Interval:  152 QRS Duration: 64 QT Interval:  316 QTC Calculation: 407 R Axis:   78 Text Interpretation: Normal sinus rhythm Normal ECG No significant change since last tracing Confirmed by Gareth Morgan [phone]) on 11/21/2020 11:00:16 PM  Radiology DG Chest 2 View  Result Date: 11/21/2020 CLINICAL DATA:  Chest pain and shortness of breath. EXAM: CHEST - 2 VIEW COMPARISON:  September 24, 2020 FINDINGS: The heart size and mediastinal contours are within normal limits. Both lungs are clear. The visualized skeletal structures are unremarkable. IMPRESSION: No active cardiopulmonary disease. Electronically Signed   By: Virgina Norfolk M.D.   On: 11/21/2020 21:01    Procedures Procedures   Medications Ordered in ED Medications - No data to display  ED Course  I have reviewed the triage vital signs and the nursing notes.  Pertinent labs & imaging results that were available during my care of the patient were reviewed by me and considered in my medical decision making (see chart for details).    MDM Rules/Calculators/A&P                           45yo female with history of asthma, breast cancer, presents with concern for chest pain and shortness of breath.   Differential diagnosis for chest pain includes pulmonary embolus, dissection, pneumothorax, pneumonia, ACS, myocarditis, pericarditis.  EKG was done and evaluate by me and showed no acute ST changes and no signs of pericarditis. Chest x-ray was done and evaluated by me and radiology and showed no sign of pneumonia or pneumothorax.  Patient is low risk HEART score initial troponin negative.  Do not feel history or exam are consistent with aortic dissection.  DDimer and troponin pending at time of transfer of care.       Final Clinical  Impression(s) / ED Diagnoses Final diagnoses:  Precordial pain    Rx / DC Orders ED Discharge Orders     None        Gareth Morgan, MD 11/22/20 2210

## 2020-11-22 LAB — D-DIMER, QUANTITATIVE: D-Dimer, Quant: 0.27 ug/mL-FEU (ref 0.00–0.50)

## 2020-11-22 LAB — TROPONIN I (HIGH SENSITIVITY): Troponin I (High Sensitivity): <2 ng/L (ref <18)

## 2021-03-18 ENCOUNTER — Emergency Department (HOSPITAL_BASED_OUTPATIENT_CLINIC_OR_DEPARTMENT_OTHER)
Admission: EM | Admit: 2021-03-18 | Discharge: 2021-03-18 | Disposition: A | Discharge status: Home / Self Care | Payer: 59 | Attending: Emergency Medicine | Admitting: Emergency Medicine

## 2021-03-18 ENCOUNTER — Other Ambulatory Visit: Payer: Self-pay

## 2021-03-18 ENCOUNTER — Emergency Department (HOSPITAL_BASED_OUTPATIENT_CLINIC_OR_DEPARTMENT_OTHER): Payer: 59

## 2021-03-18 ENCOUNTER — Encounter (HOSPITAL_BASED_OUTPATIENT_CLINIC_OR_DEPARTMENT_OTHER): Payer: Self-pay | Admitting: Emergency Medicine

## 2021-03-18 DIAGNOSIS — R059 Cough, unspecified: Secondary | ICD-10-CM | POA: Diagnosis present

## 2021-03-18 DIAGNOSIS — J40 Bronchitis, not specified as acute or chronic: Secondary | ICD-10-CM | POA: Insufficient documentation

## 2021-03-18 DIAGNOSIS — Z20822 Contact with and (suspected) exposure to covid-19: Secondary | ICD-10-CM | POA: Insufficient documentation

## 2021-03-18 DIAGNOSIS — Z853 Personal history of malignant neoplasm of breast: Secondary | ICD-10-CM | POA: Diagnosis not present

## 2021-03-18 DIAGNOSIS — R42 Dizziness and giddiness: Secondary | ICD-10-CM | POA: Insufficient documentation

## 2021-03-18 DIAGNOSIS — J45909 Unspecified asthma, uncomplicated: Secondary | ICD-10-CM | POA: Insufficient documentation

## 2021-03-18 LAB — RESP PANEL BY RT-PCR (FLU A&B, COVID) ARPGX2
Influenza A by PCR: NEGATIVE
Influenza B by PCR: NEGATIVE
SARS Coronavirus 2 by RT PCR: NEGATIVE

## 2021-03-18 MED ORDER — BENZONATATE 100 MG PO CAPS: 100.0000 mg | ORAL_CAPSULE | Freq: Three times a day (TID) | ORAL | 0 refills | Status: DC

## 2021-03-18 MED ORDER — ALBUTEROL SULFATE HFA 108 (90 BASE) MCG/ACT IN AERS: 1.0000 | INHALATION_SPRAY | Freq: Four times a day (QID) | RESPIRATORY_TRACT | 0 refills | Status: DC | PRN

## 2021-03-18 NOTE — ED Triage Notes (Signed)
Pt arrives pov with c/o HA, cough and head and chest congestion x 1 week ?

## 2021-03-18 NOTE — ED Provider Notes (Addendum)
?[facility] EMERGENCY DEPARTMENT ?Provider Note ? ? ?CSN: [id number] ?Arrival date & time: 03/18/21  1509 ? ?  ? ?History ? ?Chief Complaint  ?Patient presents with  ? Cough  ? ? ?Evelyn Cantrell is a 46 y.o. female with medical history of asthma, breast cancer in remission.  Patient presents to ED for evaluation of cough.  Patient states that last Saturday she began having sore throat at this time began taking medicine to include Tylenol Sinus and cold, Mucinex, TheraFlu.  Patient states that this alleviated symptoms momentarily however symptoms always seem to return.  Patient states that now she is experiencing a bad cough and recently experienced a fever on Monday and Tuesday.  Patient denies any recent fever in the last 48 hours.  Patient states that she no longer has a sore throat but has felt lightheaded the last 2 days on exertion.  Patient states that she has had no appetite beginning of the week however her appetite is returned.  Patient denies any known sick contacts.  Patient states she is vaccinated against COVID-19 but not for influenza.  Patient states that she is in remission in respect to her breast cancer, her last radiation treatment was over 2 years ago.  Patient endorsing lightheadedness, wheezing, cough, fever, sore throat.  Patient denies nausea, vomiting, diarrhea, weakness, syncope, chest pain. ? ? ? ? ? ?Cough ?Associated symptoms: fever, sore throat and wheezing   ?Associated symptoms: no chest pain and no shortness of breath   ? ?  ? ?Home Medications ?Prior to Admission medications   ?Medication Sig Start Date End Date Taking? Authorizing Provider  ?benzonatate (TESSALON) 100 MG capsule Take 1 capsule (100 mg total) by mouth every 8 (eight) hours. 03/18/21  Yes Azucena Cecil, PA-C  ?tamoxifen (NOLVADEX) 20 MG tablet  10/10/19   [provider]  ?   ? ?Allergies    ?Patient has no known allergies.   ? ?Review of Systems   ?Review of Systems  ?Constitutional:  Positive  for fever.  ?HENT:  Positive for sore throat.   ?Respiratory:  Positive for cough and wheezing. Negative for shortness of breath.   ?Cardiovascular:  Negative for chest pain.  ?Gastrointestinal:  Negative for diarrhea, nausea and vomiting.  ?Neurological:  Positive for light-headedness. Negative for syncope and weakness.  ?All other systems reviewed and are negative. ? ?Physical Exam ?Updated Vital Signs ?BP (!) 135/91   Pulse 80   Temp 98.4 ?F (36.9 ?C) (Oral)   Resp 18   Ht '5\' 2"'$  (1.575 m)   Wt 66.7 kg   LMP 03/06/2021   SpO2 100%   BMI 26.89 kg/m?  ?Physical Exam ?Vitals and nursing note reviewed.  ?Constitutional:   ?   General: She is not in acute distress. ?   Appearance: Normal appearance. She is not ill-appearing, toxic-appearing or diaphoretic.  ?HENT:  ?   Head: Normocephalic and atraumatic.  ?   Nose: Nose normal. No congestion.  ?   Mouth/Throat:  ?   Mouth: Mucous membranes are moist.  ?   Pharynx: Oropharynx is clear. Posterior oropharyngeal erythema present.  ?Eyes:  ?   Extraocular Movements: Extraocular movements intact.  ?   Conjunctiva/sclera: Conjunctivae normal.  ?   Pupils: Pupils are equal, round, and reactive to light.  ?Cardiovascular:  ?   Rate and Rhythm: Normal rate and regular rhythm.  ?Pulmonary:  ?   Effort: Pulmonary effort is normal.  ?   Breath sounds: Normal breath  sounds. No stridor. No wheezing, rhonchi or rales.  ?Abdominal:  ?   General: Abdomen is flat. Bowel sounds are normal.  ?   Palpations: Abdomen is soft.  ?Musculoskeletal:     ?   General: Normal range of motion.  ?   Cervical back: Normal range of motion and neck supple. No tenderness.  ?Skin: ?   General: Skin is warm and dry.  ?   Capillary Refill: Capillary refill takes less than 2 seconds.  ?Neurological:  ?   Mental Status: She is alert and oriented to person, place, and time.  ? ? ?ED Results / Procedures / Treatments   ?Labs ?(all labs ordered are listed, but only abnormal results are displayed) ?Labs  Reviewed  ?RESP PANEL BY RT-PCR (FLU A&B, COVID) ARPGX2  ? ? ?EKG ?None ? ?Radiology ?DG Chest 2 View ? ?Result Date: 03/18/2021 ?CLINICAL DATA:  Headache, cough, head congestion, chest congestion for 1 week, recent fever, smoker, asthma EXAM: CHEST - 2 VIEW COMPARISON:  11/21/2020 FINDINGS: Normal heart size, mediastinal contours, and pulmonary vascularity. Minimal peribronchial thickening, chronic, could be related to asthma, bronchitis, or smoking. Lungs clear. No pleural effusion or pneumothorax. Bones unremarkable. IMPRESSION: Mild peribronchial thickening question bronchitis, asthma or related to smoking. No acute infiltrate. Electronically Signed   By: Lavonia Dana M.D.   On: 03/18/2021 17:14   ? ?Procedures ?Procedures  ? ? ?Medications Ordered in ED ?Medications - No data to display ? ?ED Course/ Medical Decision Making/ A&P ?  ?                        ?Medical Decision Making ?Amount and/or Complexity of Data Reviewed ?Radiology: ordered. ? ?Risk ?Prescription drug management. ? ? ?46 year old female presents ED for evaluation of cough.  Please see HPI for further details. ?On examination, patient is afebrile, nontachycardic, not hypoxic.  The patient has clear lung sounds bilaterally.  Patient abdomen soft and compressible in all 4 quadrants.  Patient posterior oropharynx has erythema however no exudate. ? ?Patient worked up utilizing following labs imaging studies personally interpreted by me: ?- Respiratory panel negative for all ?- Chest x-ray shows possible signs of bronchitis ? ?At this time, most likely cause of patient symptoms is due to bronchitis.  I advised the patient to decrease or completely stop smoking cigarettes.  I have encouraged the patient to follow-up with her PCP for any ongoing needs.  I will provide the patient with prescription for [facility], provide return precautions.  Patient albuterol inhaler also refilled at her request.  Patient voiced understanding return precautions.   Patient had all of her questions answered to her satisfaction.  Patient stable at this time for discharge. ? ?Final Clinical Impression(s) / ED Diagnoses ?Final diagnoses:  ?Bronchitis  ? ? ?Rx / DC Orders ?ED Discharge Orders   ? ?      Ordered  ?  benzonatate (TESSALON) 100 MG capsule  Every 8 hours       ? 03/18/21 1646  ? ?  ?  ? ?  ? ? ?  ? ? ?  ?Azucena Cecil, PA-C ?03/18/21 1729 ? ?  ?Blanchie Dessert, MD ?03/18/21 2159 ? ?

## 2021-03-18 NOTE — Discharge Instructions (Addendum)
Please return to the ED with any new symptoms such as shortness of breath, chest pain ?Please pick up your prescription medication sent in for you. ?Please attempt to lessen the amount of cigarettes you smoke over the next couple of days.  I believe this is contributing to your symptoms. ?Please review the attached informational guidance concerning bronchitis that included ?Please follow-up with your PCP for any ongoing needs ?

## 2021-07-16 ENCOUNTER — Other Ambulatory Visit: Payer: 59

## 2021-07-16 ENCOUNTER — Ambulatory Visit: Payer: 59 | Admitting: Hematology & Oncology

## 2021-07-25 ENCOUNTER — Encounter: Payer: Self-pay | Admitting: Hematology & Oncology

## 2021-07-25 ENCOUNTER — Inpatient Hospital Stay (HOSPITAL_BASED_OUTPATIENT_CLINIC_OR_DEPARTMENT_OTHER): Payer: 59 | Admitting: Hematology & Oncology

## 2021-07-25 ENCOUNTER — Inpatient Hospital Stay: Payer: 59 | Attending: Pain Medicine

## 2021-07-25 VITALS — BP 135/95 | HR 74 | Temp 98.4°F | Resp 20 | Ht 62.0 in | Wt 144.8 lb

## 2021-07-25 DIAGNOSIS — C50011 Malignant neoplasm of nipple and areola, right female breast: Secondary | ICD-10-CM

## 2021-07-25 DIAGNOSIS — Z79810 Long term (current) use of selective estrogen receptor modulators (SERMs): Secondary | ICD-10-CM | POA: Diagnosis not present

## 2021-07-25 DIAGNOSIS — Z79899 Other long term (current) drug therapy: Secondary | ICD-10-CM

## 2021-07-25 DIAGNOSIS — C801 Malignant (primary) neoplasm, unspecified: Secondary | ICD-10-CM

## 2021-07-25 DIAGNOSIS — C50911 Malignant neoplasm of unspecified site of right female breast: Secondary | ICD-10-CM

## 2021-07-25 DIAGNOSIS — Z923 Personal history of irradiation: Secondary | ICD-10-CM

## 2021-07-25 DIAGNOSIS — Z170 Estrogen receptor positive status [ER+]: Secondary | ICD-10-CM | POA: Diagnosis not present

## 2021-07-25 DIAGNOSIS — F17210 Nicotine dependence, cigarettes, uncomplicated: Secondary | ICD-10-CM | POA: Insufficient documentation

## 2021-07-25 LAB — CMP (CANCER CENTER ONLY)
ALT: 7 U/L (ref 0–44)
AST: 13 U/L — ABNORMAL LOW (ref 15–41)
Albumin: 4.2 g/dL (ref 3.5–5.0)
Alkaline Phosphatase: 51 U/L (ref 38–126)
Anion gap: 6 (ref 5–15)
BUN: 9 mg/dL (ref 6–20)
CO2: 28 mmol/L (ref 22–32)
Calcium: 9.5 mg/dL (ref 8.9–10.3)
Chloride: 106 mmol/L (ref 98–111)
Creatinine: 0.99 mg/dL (ref 0.44–1.00)
GFR, Estimated: >60 mL/min (ref >60)
Glucose, Bld: 88 mg/dL (ref 70–99)
Potassium: 4.1 mmol/L (ref 3.5–5.1)
Sodium: 140 mmol/L (ref 135–145)
Total Bilirubin: 0.5 mg/dL (ref 0.3–1.2)
Total Protein: 7.1 g/dL (ref 6.5–8.1)

## 2021-07-25 LAB — CBC WITH DIFFERENTIAL (CANCER CENTER ONLY)
Abs Immature Granulocytes: 0.01 10*3/uL (ref 0.00–0.07)
Basophils Absolute: 0 10*3/uL (ref 0.0–0.1)
Basophils Relative: 1 %
Eosinophils Absolute: 0.1 10*3/uL (ref 0.0–0.5)
Eosinophils Relative: 2 %
HCT: 38.5 % (ref 36.0–46.0)
Hemoglobin: 12.9 g/dL (ref 12.0–15.0)
Immature Granulocytes: 0 %
Lymphocytes Relative: 50 %
Lymphs Abs: 2.8 10*3/uL (ref 0.7–4.0)
MCH: 32.7 pg (ref 26.0–34.0)
MCHC: 33.5 g/dL (ref 30.0–36.0)
MCV: 97.7 fL (ref 80.0–100.0)
Monocytes Absolute: 0.4 10*3/uL (ref 0.1–1.0)
Monocytes Relative: 8 %
Neutro Abs: 2.2 10*3/uL (ref 1.7–7.7)
Neutrophils Relative %: 39 %
Platelet Count: 213 10*3/uL (ref 150–400)
RBC: 3.94 MIL/uL (ref 3.87–5.11)
RDW: 13.5 % (ref 11.5–15.5)
WBC Count: 5.6 10*3/uL (ref 4.0–10.5)
nRBC: 0 % (ref 0.0–0.2)

## 2021-07-25 LAB — LACTATE DEHYDROGENASE: LDH: 175 U/L (ref 98–192)

## 2021-07-25 LAB — VITAMIN D 25 HYDROXY (VIT D DEFICIENCY, FRACTURES): Vit D, 25-Hydroxy: 25.83 ng/mL — ABNORMAL LOW (ref 30–100)

## 2021-07-25 MED ORDER — NICOTINE 21 MG/24HR TD PT24
21.0000 mg | MEDICATED_PATCH | Freq: Every day | TRANSDERMAL | 0 refills | Status: DC
Start: 2021-07-25 — End: 2021-10-26

## 2021-07-25 NOTE — Progress Notes (Signed)
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  : I need a cancer doctor.  HPI: Evelyn Cantrell is a very charming 46 year old premenopausal African-American female.  She is originally from [place].  She moved down to [place] to help take care of her grandmother.  While she was down there, she was found to have a lump in her right breast.  She subsequently underwent an evaluation which confirmed that she had breast cancer.  She underwent a lumpectomy on 02/23/2018.  At the time, she was found to have a stage IA (T1aN0M0) invasive ductal carcinoma.  She had a tumor is moderately differentiated.  It is 5 mm.  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  She had a sentinel lymph node that was negative.  She had Oncotype testing of 3.  She underwent adjuvant radiation therapy.  She did well with this.  Patient was then placed on tamoxifen.  She subsequently has moved back up to [place].  She needs an oncologist.  She does have other health issues.  She been having some pain in the right breast and right arm.  I think she is seeing neurology for this.  She has 1 son.  She had him when she was 47 years old.  She said that she stopped having her monthly cycles with starting tamoxifen.  She been off it for a little bit and then her cycle restarted.  She now is back on tamoxifen.  She has had no family history of breast cancer on her mother side of the family.  She was not genetically tested.  She was negative for the BRCA gene.  She has a past history of tobacco use.  She still smokes.  She would like to stop smoking.  She has rare alcohol use.  She has had no change in bowel or bladder habits.  She has had no nausea or vomiting.  She has had no leg swelling.  She has had little bit  of swelling in the right arm.  Overall, I would say her performance status is probably ECOG 1.    Past Medical History:  Diagnosis Date   Asthma    Cancer ([place])    COVID-19 virus infection 12/2019   Overweight (BMI 25.0-29.9)   :   Past Surgical History:  Procedure Laterality Date   BREAST LUMPECTOMY Right   :   Current Outpatient Medications:    albuterol (VENTOLIN HFA) 108 (90 Base) MCG/ACT inhaler, Inhale 1-2 puffs into the lungs every 6 (six) hours as needed for wheezing or shortness of breath., Disp: 18 g, Rfl: 0   citalopram (CELEXA) 10 MG tablet, Take by mouth., Disp: , Rfl:    fluticasone (FLONASE) 50 MCG/ACT nasal spray, Place into the nose., Disp: , Rfl:    hydrOXYzine (ATARAX) 25 MG tablet, Take 25 mg by mouth daily as needed., Disp: , Rfl:    tamoxifen (NOLVADEX) 20 MG tablet, 20 mg daily., Disp: , Rfl:    zolpidem (AMBIEN) 10 MG tablet, Take 10 mg by mouth at bedtime., Disp: , Rfl:    omeprazole (PRILOSEC) 20 MG capsule, Take 20 mg by mouth daily. (Patient not taking: Reported on 07/25/2021), Disp: , Rfl: :  :  Not on File:  No family history on  file.:   Social History   Socioeconomic History   Marital status: Single    Spouse name: Not on file   Number of children: Not on file   Years of education: Not on file   Highest education level: Not on file  Occupational History   Not on file  Tobacco Use   Smoking status: Every Day    Packs/day: 0.50    Types: Cigarettes   Smokeless tobacco: Never  Vaping Use   Vaping Use: Never used  Substance and Sexual Activity   Alcohol use: Yes    Comment: occasional wine.   Drug use: No   Sexual activity: Not Currently  Other Topics Concern   Not on file  Social History Narrative   Not on file   Social Determinants of Health   Financial Resource Strain: Not on file  Food Insecurity: Not on file  Transportation Needs: Not on file  Physical Activity: Not on file  Stress: Not on file  Social Connections:  Not on file  Intimate Partner Violence: Not on file  :  Review of Systems  Constitutional: Negative.   HENT: Negative.    Eyes: Negative.   Respiratory: Negative.    Cardiovascular: Negative.   Gastrointestinal: Negative.   Genitourinary: Negative.   Musculoskeletal:  Positive for joint pain and myalgias.  Skin:  Positive for rash.  Neurological: Negative.   Endo/Heme/Allergies: Negative.   Psychiatric/Behavioral: Negative.       Exam: Her vital signs show temperature of 98.4.  Pulse 74.  Blood pressure 135/95.  Weight is 144 pounds. _0 @ Physical Exam Vitals reviewed.  Constitutional:      Comments: Her breast exam shows left breast no masses, edema or erythema.  There is no left axillary adenopathy.  Her right breast shows the lumpectomy at about the 5 o'clock position.  She has a well-healed surgical scar.  She has no areola swelling.  There is no obvious mass in the right breast.  There is no right axillary adenopathy.  HENT:     Head: Normocephalic and atraumatic.  Eyes:     Pupils: Pupils are equal, round, and reactive to light.  Cardiovascular:     Rate and Rhythm: Normal rate and regular rhythm.     Heart sounds: Normal heart sounds.  Pulmonary:     Effort: Pulmonary effort is normal.     Breath sounds: Normal breath sounds.  Abdominal:     General: Bowel sounds are normal.     Palpations: Abdomen is soft.  Musculoskeletal:        General: No tenderness or deformity. Normal range of motion.     Cervical back: Normal range of motion.  Lymphadenopathy:     Cervical: No cervical adenopathy.  Skin:    General: Skin is warm and dry.     Findings: No erythema or rash.     Comments: On her back, she has a very large birthmark.  Neurological:     Mental Status: She is alert and oriented to person, place, and time.  Psychiatric:        Behavior: Behavior normal.        Thought Content: Thought content normal.        Judgment: Judgment normal.     Recent  Labs    07/25/21 1412  WBC 5.6  HGB 12.9  HCT 38.5  PLT 213    Recent Labs    07/25/21 1412  NA 140  K 4.1  CL 106  CO2 28  GLUCOSE 88  BUN 9  CREATININE 0.99  CALCIUM 9.5    Blood smear review: None  Pathology: See above    Assessment and Plan: Evelyn Cantrell is a very charming 46 year old African-American.  She has a early stage-stage IA -ductal carcinoma of the right breast.  She has incredibly low Oncotype score of 3.  She has had lumpectomy.  She has had radiation therapy.  She is on tamoxifen.  I think her risk of recurrence is easily less than 10%.  I would think the risk of recurrence is probably less than 5%.  I we will keep her on tamoxifen.  I probably think she needs tamoxifen probably for 5 years.  I am not sure that further tamoxifen after 5 years would be of benefit for her.  She will need her mammogram and or MRI.  We will have to see how we need to get this all set up.  I do not see that we have to switch her over to an aromatase inhibitor.  We will see what her menopausal status is.  When she is postmenopausal, then we will switch her over.  She is very nice.  It was wonderful talking with her.  She really has a good attitude.  I am glad that she moved back to [place].  She is incredibly charming and has a great motivation.  I will call in a NicoDerm patch for her to hopefully get her to stop smoking.  We will plan to get her back in 3 months.

## 2021-07-26 ENCOUNTER — Encounter: Payer: Self-pay | Admitting: *Deleted

## 2021-07-26 LAB — LUTEINIZING HORMONE: LH: 10.2 m[IU]/mL

## 2021-07-26 LAB — FOLLICLE STIMULATING HORMONE: FSH: 15.1 m[IU]/mL

## 2021-07-27 LAB — ESTRADIOL, ULTRA SENS: Estradiol, Sensitive: 75.6 pg/mL

## 2021-08-01 ENCOUNTER — Encounter: Payer: Self-pay | Admitting: *Deleted

## 2021-10-04 ENCOUNTER — Other Ambulatory Visit (HOSPITAL_BASED_OUTPATIENT_CLINIC_OR_DEPARTMENT_OTHER): Payer: Self-pay

## 2021-10-04 ENCOUNTER — Other Ambulatory Visit: Payer: Self-pay

## 2021-10-04 ENCOUNTER — Emergency Department (HOSPITAL_BASED_OUTPATIENT_CLINIC_OR_DEPARTMENT_OTHER)
Admission: EM | Admit: 2021-10-04 | Discharge: 2021-10-04 | Disposition: A | Discharge status: Home / Self Care | Payer: 59 | Attending: Emergency Medicine | Admitting: Emergency Medicine

## 2021-10-04 ENCOUNTER — Encounter (HOSPITAL_BASED_OUTPATIENT_CLINIC_OR_DEPARTMENT_OTHER): Payer: Self-pay

## 2021-10-04 DIAGNOSIS — K0889 Other specified disorders of teeth and supporting structures: Secondary | ICD-10-CM | POA: Insufficient documentation

## 2021-10-04 DIAGNOSIS — J45909 Unspecified asthma, uncomplicated: Secondary | ICD-10-CM | POA: Insufficient documentation

## 2021-10-04 DIAGNOSIS — Z853 Personal history of malignant neoplasm of breast: Secondary | ICD-10-CM | POA: Insufficient documentation

## 2021-10-04 MED ORDER — ACETAMINOPHEN 325 MG PO TABS
650.0000 mg | ORAL_TABLET | Freq: Once | ORAL | Status: AC
  Administered 2021-10-04: 650 mg via ORAL
  Filled 2021-10-04: qty 2

## 2021-10-04 MED ORDER — PENICILLIN V POTASSIUM 500 MG PO TABS
500.0000 mg | ORAL_TABLET | Freq: Four times a day (QID) | ORAL | 0 refills | Status: AC
  Filled 2021-10-04: qty 28, 7d supply, fill #0

## 2021-10-04 NOTE — Discharge Instructions (Signed)
Likely you have a dental infection, started on antibiotics please take as prescribed, you may use over-the-counter pain medication as needed.  I have given you information for a dentist please contact them for further evaluation.  Come back to the emergency department if you develop chest pain, shortness of breath, severe abdominal pain, uncontrolled nausea, vomiting, diarrhea.

## 2021-10-04 NOTE — ED Provider Notes (Signed)
[facility] EMERGENCY DEPARTMENT Provider Note   CSN: [id number] Arrival date & time: 10/04/21  1027     History  Chief Complaint  Patient presents with   Dental Pain    Evelyn Cantrell is a 46 y.o. female.  HPI   Patient with medical history including stage I breast cancer status post lumpectomy, asthma presents  with complaints of dental pain.  Patient states dental pain for last few months but over the last day pain is gotten worse, states she feels pain mainly in her right upper teeth, no traumatic injury, states that she wore veneers but  they had broken off and now her teeth have decayed, states that she is some facial swelling but denies tongue throat lip swelling difficulty breathing no difficulty with eye movement, no chest pain or shortness of breath no systemic infection.  She has no other complaints at this time.    Home Medications Prior to Admission medications   Medication Sig Start Date End Date Taking? Authorizing Provider  penicillin v potassium (VEETID) 500 MG tablet Take 1 tablet (500 mg total) by mouth 4 (four) times daily for 7 days. 10/04/21 10/11/21 Yes Marcello Fennel, PA-C  tamoxifen (NOLVADEX) 20 MG tablet 20 mg daily. 10/10/19  Yes [provider]  albuterol (VENTOLIN HFA) 108 (90 Base) MCG/ACT inhaler Inhale 1-2 puffs into the lungs every 6 (six) hours as needed for wheezing or shortness of breath. 03/18/21   Azucena Cecil, PA-C  citalopram (CELEXA) 10 MG tablet Take by mouth. 06/18/21   [provider]  fluticasone (FLONASE) 50 MCG/ACT nasal spray Place into the nose. 01/06/19   [provider]  hydrOXYzine (ATARAX) 25 MG tablet Take 25 mg by mouth daily as needed. 04/23/21   [provider]  nicotine (NICODERM CQ) 21 mg/24hr patch Place 1 patch (21 mg total) onto the skin daily. 07/25/21   Volanda Napoleon, MD  omeprazole (PRILOSEC) 20 MG capsule Take 20 mg by mouth daily. Patient not taking: Reported on  07/25/2021 04/01/21   [provider]  zolpidem (AMBIEN) 10 MG tablet Take 10 mg by mouth at bedtime. 04/23/21   [provider]      Allergies    Patient has no known allergies.    Review of Systems   Review of Systems  Constitutional:  Negative for chills and fever.  HENT:  Positive for dental problem.   Respiratory:  Negative for shortness of breath.   Cardiovascular:  Negative for chest pain.  Gastrointestinal:  Negative for abdominal pain.  Neurological:  Negative for headaches.    Physical Exam Updated Vital Signs BP (!) 138/103   Pulse 91   Temp 98.3 F (36.8 C) (Oral)   Resp 16   Ht '5\' 2"'$  (1.575 m)   Wt 66.2 kg   SpO2 100%   BMI 26.70 kg/m  Physical Exam Vitals and nursing note reviewed.  Constitutional:      General: She is not in acute distress.    Appearance: She is not ill-appearing.  HENT:     Head: Normocephalic and atraumatic.     Nose: No congestion.     Mouth/Throat:     Mouth: Mucous membranes are moist.     Pharynx: Oropharynx is clear. No oropharyngeal exudate or posterior oropharyngeal erythema.     Comments: No trismus no torticollis no oral edema present, tongue uvula midline controlling oral secretions tonsils both equal symmetric bilaterally no submandibular swelling, patient had poor dental hygiene  multiple teeth in various stages of decay, mainly in the right upper jaw, no evidence of gingivitis no fluctuance or induration noted. Eyes:     Extraocular Movements: Extraocular movements intact.     Conjunctiva/sclera: Conjunctivae normal.     Comments: No erythema or edema around the periorbital region no proptosis  Cardiovascular:     Rate and Rhythm: Normal rate and regular rhythm.     Pulses: Normal pulses.     Heart sounds: No murmur heard.    No friction rub. No gallop.  Pulmonary:     Effort: No respiratory distress.     Breath sounds: No wheezing, rhonchi or rales.  Skin:    General: Skin is warm and dry.   Neurological:     Mental Status: She is alert.  Psychiatric:        Mood and Affect: Mood normal.     ED Results / Procedures / Treatments   Labs (all labs ordered are listed, but only abnormal results are displayed) Labs Reviewed - No data to display  EKG None  Radiology No results found.  Procedures Procedures    Medications Ordered in ED Medications  acetaminophen (TYLENOL) tablet 650 mg (650 mg Oral Given 10/04/21 1247)    ED Course/ Medical Decision Making/ A&P                           Medical Decision Making Risk OTC drugs. Prescription drug management.   This patient presents to the ED for concern of dental pain, this involves an extensive number of treatment options, and is a complaint that carries with it a high risk of complications and morbidity.  The differential diagnosis includes lugina angina, retropharyngeal/peritonsillar abscess    Additional history obtained:  Additional history obtained from N/A External records from outside source obtained and reviewed including N/A   Co morbidities that complicate the patient evaluation  Malignancy  Social Determinants of Health:  N/A    Lab Tests:  I Ordered, and personally interpreted labs.  The pertinent results include: N/A   Imaging Studies ordered:  I ordered imaging studies including N/A I independently visualized and interpreted imaging which showed N/A I agree with the radiologist interpretation   Cardiac Monitoring:  The patient was maintained on a cardiac monitor.  I personally viewed and interpreted the cardiac monitored which showed an underlying rhythm of: N/A   Medicines ordered and prescription drug management:  I ordered medication including Tylenol for pain I have reviewed the patients home medicines and have made adjustments as needed  Critical Interventions:  N/A   Reevaluation:  Presents with dental pain, benign physical exam, agreement with plan discharge  at this time.  Consultations Obtained:  N/A   Test Considered:  CT of the maxillofacial-as my suspicion for facial abscess/periorbital cellulitis is very low at this time there is no edema or erythema around the periorbital regions no proptosis, no pain with eye movements there is no overlying skin changes The face and/or swelling present during my exam.    Rule out I have low suspicion for peritonsillar abscess, retropharyngeal abscess, or Ludwig angina as oropharynx was visualized tongue and uvula were both midline, there is no exudates, erythema or edema noted in the posterior pillars or on/ around tonsils.  Low suspicion for an abscess as gumline were palpated no fluctuance or induration felt.  Low suspicion for periorbital or orbital cellulitis as patient face had no erythematous, patient EOMs were  intact, he had no pain with eye movement.    Dispostion and problem list  After consideration of the diagnostic results and the patients response to treatment, I feel that the patent would benefit from discharge.  Dental pain-suspect from cavities as well as tooth decay resulting in expose teeth roots, will start on antibiotics for possible infection and referred to dentist for further evaluation and strict return precautions.            Final Clinical Impression(s) / ED Diagnoses Final diagnoses:  Pain, dental    Rx / DC Orders ED Discharge Orders          Ordered    penicillin v potassium (VEETID) 500 MG tablet  4 times daily        10/04/21 1234              Aron Baba 10/04/21 1309    Gareth Morgan, MD 10/05/21 2345

## 2021-10-04 NOTE — ED Triage Notes (Signed)
Pt reports dental pain right upper gum. Veneer fell off months ago and unable to replace. Pain increased last night. Facial swelling

## 2021-10-04 NOTE — ED Notes (Signed)
Requesting Tylenol before leaving. Provider aware

## 2021-10-26 ENCOUNTER — Encounter: Payer: Self-pay | Admitting: Hematology & Oncology

## 2021-10-26 ENCOUNTER — Other Ambulatory Visit (HOSPITAL_BASED_OUTPATIENT_CLINIC_OR_DEPARTMENT_OTHER): Payer: Self-pay

## 2021-10-26 ENCOUNTER — Inpatient Hospital Stay (HOSPITAL_BASED_OUTPATIENT_CLINIC_OR_DEPARTMENT_OTHER): Payer: Commercial Managed Care - HMO | Admitting: Hematology & Oncology

## 2021-10-26 ENCOUNTER — Inpatient Hospital Stay: Payer: Commercial Managed Care - HMO | Attending: Hematology & Oncology

## 2021-10-26 VITALS — BP 143/96 | HR 80 | Temp 98.2°F | Resp 17 | Wt 143.1 lb

## 2021-10-26 DIAGNOSIS — R350 Frequency of micturition: Secondary | ICD-10-CM

## 2021-10-26 DIAGNOSIS — C50011 Malignant neoplasm of nipple and areola, right female breast: Secondary | ICD-10-CM

## 2021-10-26 DIAGNOSIS — Z170 Estrogen receptor positive status [ER+]: Secondary | ICD-10-CM | POA: Insufficient documentation

## 2021-10-26 DIAGNOSIS — Z79810 Long term (current) use of selective estrogen receptor modulators (SERMs): Secondary | ICD-10-CM | POA: Diagnosis not present

## 2021-10-26 DIAGNOSIS — Z79899 Other long term (current) drug therapy: Secondary | ICD-10-CM | POA: Diagnosis not present

## 2021-10-26 DIAGNOSIS — R300 Dysuria: Secondary | ICD-10-CM | POA: Insufficient documentation

## 2021-10-26 DIAGNOSIS — C50911 Malignant neoplasm of unspecified site of right female breast: Secondary | ICD-10-CM

## 2021-10-26 LAB — CBC WITH DIFFERENTIAL (CANCER CENTER ONLY)
Abs Immature Granulocytes: 0.02 10*3/uL (ref 0.00–0.07)
Basophils Absolute: 0 10*3/uL (ref 0.0–0.1)
Basophils Relative: 1 %
Eosinophils Absolute: 0.1 10*3/uL (ref 0.0–0.5)
Eosinophils Relative: 2 %
HCT: 39.6 % (ref 36.0–46.0)
Hemoglobin: 13.2 g/dL (ref 12.0–15.0)
Immature Granulocytes: 0 %
Lymphocytes Relative: 47 %
Lymphs Abs: 2.8 10*3/uL (ref 0.7–4.0)
MCH: 32.2 pg (ref 26.0–34.0)
MCHC: 33.3 g/dL (ref 30.0–36.0)
MCV: 96.6 fL (ref 80.0–100.0)
Monocytes Absolute: 0.4 10*3/uL (ref 0.1–1.0)
Monocytes Relative: 6 %
Neutro Abs: 2.6 10*3/uL (ref 1.7–7.7)
Neutrophils Relative %: 44 %
Platelet Count: 189 10*3/uL (ref 150–400)
RBC: 4.1 MIL/uL (ref 3.87–5.11)
RDW: 13.3 % (ref 11.5–15.5)
WBC Count: 5.9 10*3/uL (ref 4.0–10.5)
nRBC: 0 % (ref 0.0–0.2)

## 2021-10-26 LAB — URINALYSIS, COMPLETE (UACMP) WITH MICROSCOPIC
Bilirubin Urine: NEGATIVE
Glucose, UA: NEGATIVE mg/dL
Ketones, ur: NEGATIVE mg/dL
Leukocytes,Ua: NEGATIVE
Nitrite: NEGATIVE
Protein, ur: NEGATIVE mg/dL
Specific Gravity, Urine: 1.01 (ref 1.005–1.030)
pH: 6.5 (ref 5.0–8.0)

## 2021-10-26 LAB — CMP (CANCER CENTER ONLY)
ALT: 7 U/L (ref 0–44)
AST: 14 U/L — ABNORMAL LOW (ref 15–41)
Albumin: 4.2 g/dL (ref 3.5–5.0)
Alkaline Phosphatase: 54 U/L (ref 38–126)
Anion gap: 8 (ref 5–15)
BUN: 7 mg/dL (ref 6–20)
CO2: 27 mmol/L (ref 22–32)
Calcium: 9.5 mg/dL (ref 8.9–10.3)
Chloride: 105 mmol/L (ref 98–111)
Creatinine: 0.97 mg/dL (ref 0.44–1.00)
GFR, Estimated: >60 mL/min (ref >60)
Glucose, Bld: 81 mg/dL (ref 70–99)
Potassium: 3.8 mmol/L (ref 3.5–5.1)
Sodium: 140 mmol/L (ref 135–145)
Total Bilirubin: 0.6 mg/dL (ref 0.3–1.2)
Total Protein: 7.1 g/dL (ref 6.5–8.1)

## 2021-10-26 LAB — LACTATE DEHYDROGENASE: LDH: 181 U/L (ref 98–192)

## 2021-10-26 MED ORDER — TELMISARTAN 20 MG PO TABS
20.0000 mg | ORAL_TABLET | Freq: Every day | ORAL | 4 refills | Status: DC
  Filled 2021-10-26: qty 30, 30d supply, fill #0
  Filled 2021-11-27: qty 30, 30d supply, fill #1
  Filled 2022-01-10: qty 30, 30d supply, fill #2
  Filled 2022-03-07: qty 30, 30d supply, fill #3
  Filled 2022-04-15 – 2022-05-03 (×2): qty 30, 30d supply, fill #4

## 2021-10-26 MED ORDER — TAMOXIFEN CITRATE 20 MG PO TABS
20.0000 mg | ORAL_TABLET | Freq: Every day | ORAL | 4 refills | Status: DC
  Filled 2021-10-26: qty 90, 90d supply, fill #0
  Filled 2022-01-10: qty 90, 90d supply, fill #1
  Filled 2022-04-15: qty 90, 90d supply, fill #2
  Filled 2022-05-03: qty 30, 30d supply, fill #2

## 2021-10-26 MED ORDER — NICOTINE 21 MG/24HR TD PT24
21.0000 mg | MEDICATED_PATCH | Freq: Every day | TRANSDERMAL | 0 refills | Status: DC
  Filled 2021-10-26: qty 28, 28d supply, fill #0

## 2021-10-26 NOTE — Progress Notes (Signed)
Hematology and Oncology Follow Up Visit  Evelyn Cantrell [id number] [birth year] 46 y.o. 10/26/2021   Principle Diagnosis:  Stage Ia (T1aN0M0) infiltrating ductal carcinoma of the right breast-ER+/PR+/HER2- --Oncotype score 3  Current Therapy:   Tamoxifen 20 mg p.o. daily-started on 06/2018     Interim History:  Evelyn Cantrell is back for second office visit.  We first saw her back in July.  At that time, she just moved up here from [place].  She is doing pretty well.  She has had no problems since we last saw her.  She has had no problems with the tamoxifen.  There is been no leg pain or swelling.  She has had no ocular issues.  She has had no cough or shortness of breath.  There has been no change in bowel or bladder habits.  She has had no headache.  She is complaining of some urinary pain.  She says she went to the emergency room recently.  I think she had possible pneumonia.  I think everything was negative.  However, she is complaining of pain in the right flank area.  There is little bit of dysuria.  She has had no fever.  Overall, I would say performance status is probably ECOG 0.  Medications:  Current Outpatient Medications:    albuterol (VENTOLIN HFA) 108 (90 Base) MCG/ACT inhaler, Inhale 1-2 puffs into the lungs every 6 (six) hours as needed for wheezing or shortness of breath., Disp: 18 g, Rfl: 0   citalopram (CELEXA) 10 MG tablet, Take by mouth., Disp: , Rfl:    fluticasone (FLONASE) 50 MCG/ACT nasal spray, Place into the nose., Disp: , Rfl:    hydrOXYzine (ATARAX) 25 MG tablet, Take 25 mg by mouth daily as needed., Disp: , Rfl:    nicotine (NICODERM CQ) 21 mg/24hr patch, Place 1 patch (21 mg total) onto the skin daily., Disp: 28 patch, Rfl: 0   omeprazole (PRILOSEC) 20 MG capsule, Take 20 mg by mouth daily., Disp: , Rfl:    tamoxifen (NOLVADEX) 20 MG tablet, 20 mg daily., Disp: , Rfl:    telmisartan (MICARDIS) 20 MG tablet, Take 1 tablet (20 mg total) by mouth daily.,  Disp: 30 tablet, Rfl: 4   zolpidem (AMBIEN) 10 MG tablet, Take 10 mg by mouth at bedtime., Disp: , Rfl:   Allergies: No Known Allergies  Past Medical History, Surgical history, Social history, and Family History were reviewed and updated.  Review of Systems: Review of Systems  Constitutional: Negative.   HENT:  Negative.    Eyes: Negative.   Respiratory: Negative.    Cardiovascular: Negative.   Gastrointestinal: Negative.   Endocrine: Negative.   Genitourinary:  Positive for dysuria.   Musculoskeletal:  Positive for flank pain.  Neurological: Negative.   Hematological: Negative.   Psychiatric/Behavioral: Negative.      Physical Exam:  weight is 143 lb 1.9 oz (64.9 kg). Her oral temperature is 98.2 F (36.8 C). Her blood pressure is 143/96 (abnormal) and her pulse is 80. Her respiration is 17 and oxygen saturation is 100%.   Wt Readings from Last 3 Encounters:  10/26/21 143 lb 1.9 oz (64.9 kg)  10/04/21 146 lb (66.2 kg)  07/25/21 144 lb 12.8 oz (65.7 kg)    Physical Exam Vitals reviewed.  Constitutional:      Comments: Breast exam shows left breast with no masses, edema or erythema.  There is no left axillary adenopathy.  Right breast shows well-healed lumpectomy at the 5 o'clock position.  She has a well-healed surgical scar.  She has no areola swelling.  There is no distinct mass in the right breast.  There is no right axillary adenopathy.  HENT:     Head: Normocephalic and atraumatic.  Eyes:     Pupils: Pupils are equal, round, and reactive to light.  Cardiovascular:     Rate and Rhythm: Normal rate and regular rhythm.     Heart sounds: Normal heart sounds.  Pulmonary:     Effort: Pulmonary effort is normal.     Breath sounds: Normal breath sounds.  Abdominal:     General: Bowel sounds are normal.     Palpations: Abdomen is soft.  Musculoskeletal:        General: No tenderness or deformity. Normal range of motion.     Cervical back: Normal range of motion.   Lymphadenopathy:     Cervical: No cervical adenopathy.  Skin:    General: Skin is warm and dry.     Findings: No erythema or rash.  Neurological:     Mental Status: She is alert and oriented to person, place, and time.  Psychiatric:        Behavior: Behavior normal.        Thought Content: Thought content normal.        Judgment: Judgment normal.      Lab Results  Component Value Date   WBC 5.9 10/26/2021   HGB 13.2 10/26/2021   HCT 39.6 10/26/2021   MCV 96.6 10/26/2021   PLT 189 10/26/2021     Chemistry      Component Value Date/Time   NA 140 10/26/2021 1335   K 3.8 10/26/2021 1335   CL 105 10/26/2021 1335   CO2 27 10/26/2021 1335   BUN 7 10/26/2021 1335   CREATININE 0.97 10/26/2021 1335      Component Value Date/Time   CALCIUM 9.5 10/26/2021 1335   ALKPHOS 54 10/26/2021 1335   AST 14 (L) 10/26/2021 1335   ALT 7 10/26/2021 1335   BILITOT 0.6 10/26/2021 1335       Impression and Plan: Evelyn Cantrell is a very charming 46 year old premenopausal African-American female with a early stage-stage Ia-breast cancer.  Of note when we last saw her, her estradiol level was 76.  We will continue her on the tamoxifen.  I sent in another prescription for her.  We will see what her urinalysis shows.  If there is any hint of a infection, we will put her on some antibiotic.  Otherwise, I think everything looks quite well.  I think the risk of recurrence is going to be less than 10%.  We will plan to get her back in another 4 months now.  Is been about 3 and half years that she has had her surgery.   Volanda Napoleon, MD 10/20/20232:37 PM

## 2021-10-28 LAB — URINE CULTURE: Culture: <10000 — AB

## 2021-10-29 ENCOUNTER — Encounter: Payer: Self-pay | Admitting: *Deleted

## 2021-11-17 ENCOUNTER — Encounter (HOSPITAL_BASED_OUTPATIENT_CLINIC_OR_DEPARTMENT_OTHER): Payer: Self-pay | Admitting: Emergency Medicine

## 2021-11-17 ENCOUNTER — Emergency Department (HOSPITAL_BASED_OUTPATIENT_CLINIC_OR_DEPARTMENT_OTHER)
Admission: EM | Admit: 2021-11-17 | Discharge: 2021-11-17 | Disposition: A | Discharge status: Home / Self Care | Payer: Commercial Managed Care - HMO | Attending: Emergency Medicine | Admitting: Emergency Medicine

## 2021-11-17 ENCOUNTER — Other Ambulatory Visit: Payer: Self-pay

## 2021-11-17 DIAGNOSIS — Z7951 Long term (current) use of inhaled steroids: Secondary | ICD-10-CM | POA: Diagnosis not present

## 2021-11-17 DIAGNOSIS — X58XXXA Exposure to other specified factors, initial encounter: Secondary | ICD-10-CM | POA: Insufficient documentation

## 2021-11-17 DIAGNOSIS — J45909 Unspecified asthma, uncomplicated: Secondary | ICD-10-CM | POA: Insufficient documentation

## 2021-11-17 DIAGNOSIS — S39012A Strain of muscle, fascia and tendon of lower back, initial encounter: Secondary | ICD-10-CM | POA: Diagnosis not present

## 2021-11-17 DIAGNOSIS — M545 Low back pain, unspecified: Secondary | ICD-10-CM | POA: Diagnosis present

## 2021-11-17 DIAGNOSIS — R350 Frequency of micturition: Secondary | ICD-10-CM | POA: Insufficient documentation

## 2021-11-17 DIAGNOSIS — Z853 Personal history of malignant neoplasm of breast: Secondary | ICD-10-CM | POA: Insufficient documentation

## 2021-11-17 LAB — URINALYSIS, ROUTINE W REFLEX MICROSCOPIC
Bilirubin Urine: NEGATIVE
Glucose, UA: NEGATIVE mg/dL
Ketones, ur: NEGATIVE mg/dL
Leukocytes,Ua: NEGATIVE
Nitrite: NEGATIVE
Protein, ur: NEGATIVE mg/dL
Specific Gravity, Urine: <=1.005 (ref 1.005–1.030)
pH: 6 (ref 5.0–8.0)

## 2021-11-17 LAB — URINALYSIS, MICROSCOPIC (REFLEX)

## 2021-11-17 LAB — PREGNANCY, URINE: Preg Test, Ur: NEGATIVE

## 2021-11-17 MED ORDER — CEPHALEXIN 500 MG PO CAPS: 500.0000 mg | ORAL_CAPSULE | Freq: Four times a day (QID) | ORAL | 0 refills | Status: DC

## 2021-11-17 MED ORDER — KETOROLAC TROMETHAMINE 15 MG/ML IJ SOLN
15.0000 mg | Freq: Once | INTRAMUSCULAR | Status: AC
  Administered 2021-11-17: 15 mg via INTRAMUSCULAR
  Filled 2021-11-17: qty 1

## 2021-11-17 MED ORDER — PREDNISONE 10 MG (21) PO TBPK: ORAL_TABLET | Freq: Every day | ORAL | 0 refills | Status: DC

## 2021-11-17 NOTE — Discharge Instructions (Addendum)
The visit emergency department today was overall reassuring.  As discussed, we will put you on a prednisone pack as well as Tylenol to take for your back pain.  Advil increases risk of bleeding with your Celexa or citalopram so be careful while taking that medication.  You did have bacteria in your urine today so we will prescribe antibiotic for UTI given your history of experiencing symptoms.  Follow MyChart for your culture results.  Recommend follow-up with urology regarding your symptoms in the future.  Pleasereturn to emergency department for worrisome signs symptoms we discussed become apparent.

## 2021-11-17 NOTE — ED Provider Notes (Signed)
[facility] Provider Note   CSN: [id number] Arrival date & time: 11/17/21  9675     History  Chief Complaint  Patient presents with   Back Pain    Evelyn Cantrell is a 46 y.o. female.   Back Pain   46 year old female presents emergency department with complaints of right low back pain.  Patient states the symptoms began approximately 3 to 4 weeks ago and have slightly gotten worse since then.  She has been seen in urgent care as well as her oncologist over the past 4 weeks and has tested negative for urinary tract infection.  She still endorses some urinary frequency but denies dysuria, malodorous urine, hematuria.  She reports pain radiating down her right buttock region.  Pain is worsened with movement.  Denies fever, saddle esthesia, bowel/bladder dysfunction, weakness/sensory deficits in lower extremities, history of IV drug use, known trauma to affected back.  She is taken ibuprofen at home which has helped her symptoms.  She presents emergency department for further evaluation.  Denies chest pain, shortness of breath, abdominal pain, nausea, vomiting, vaginal symptoms, change in bowel habits.  Past medical history significant for breast cancer, asthma  Home Medications Prior to Admission medications   Medication Sig Start Date End Date Taking? Authorizing Provider  cephALEXin (KEFLEX) 500 MG capsule Take 1 capsule (500 mg total) by mouth 4 (four) times daily. 11/17/21  Yes Dion Saucier A, PA  predniSONE (STERAPRED UNI-PAK 21 TAB) 10 MG (21) TBPK tablet Take by mouth daily. Take 6 tabs by mouth daily  for 2 days, then 5 tabs for 2 days, then 4 tabs for 2 days, then 3 tabs for 2 days, 2 tabs for 2 days, then 1 tab by mouth daily for 2 days 11/17/21  Yes Dion Saucier A, PA  albuterol (VENTOLIN HFA) 108 (90 Base) MCG/ACT inhaler Inhale 1-2 puffs into the lungs every 6 (six) hours as needed for wheezing or shortness of breath. 03/18/21   Azucena Cecil, PA-C  citalopram (CELEXA) 10 MG tablet Take by mouth. 06/18/21   [provider]  fluticasone (FLONASE) 50 MCG/ACT nasal spray Place into the nose. 01/06/19   [provider]  hydrOXYzine (ATARAX) 25 MG tablet Take 25 mg by mouth daily as needed. 04/23/21   [provider]  nicotine (NICODERM CQ) 21 mg/24hr patch Place 1 patch (21 mg total) onto the skin daily. 10/26/21   Volanda Napoleon, MD  omeprazole (PRILOSEC) 20 MG capsule Take 20 mg by mouth daily. 04/01/21   [provider]  tamoxifen (NOLVADEX) 20 MG tablet Take 1 tablet (20 mg total) by mouth daily. 10/26/21   Volanda Napoleon, MD  telmisartan (MICARDIS) 20 MG tablet Take 1 tablet (20 mg total) by mouth daily. 10/26/21   Volanda Napoleon, MD  zolpidem (AMBIEN) 10 MG tablet Take 10 mg by mouth at bedtime. 04/23/21   [provider]      Allergies    Patient has no known allergies.    Review of Systems   Review of Systems  Musculoskeletal:  Positive for back pain.  All other systems reviewed and are negative.   Physical Exam Updated Vital Signs BP 107/75 (BP Location: Left Arm)   Pulse 69   Temp 98.3 F (36.8 C) (Oral)   Resp 16   Ht '5\' 2"'$  (1.575 m)   Wt 64.9 kg   SpO2 100%   BMI 26.16 kg/m  Physical Exam Vitals and nursing note  reviewed.  Constitutional:      General: She is not in acute distress.    Appearance: She is well-developed.  HENT:     Head: Normocephalic and atraumatic.  Eyes:     Conjunctiva/sclera: Conjunctivae normal.  Cardiovascular:     Rate and Rhythm: Normal rate and regular rhythm.     Heart sounds: No murmur heard. Pulmonary:     Effort: Pulmonary effort is normal. No respiratory distress.     Breath sounds: Normal breath sounds.  Abdominal:     Palpations: Abdomen is soft.     Tenderness: There is no abdominal tenderness.  Musculoskeletal:        General: No swelling.     Cervical back: Neck supple.     Comments: No midline  tenderness of the cervical, thoracic, lumbar spine with no obvious step-off or deformity noted.  Paraspinal tenderness in the right lumbar area.  No overlying skin abnormalities noted.  Radiation to the right buttock region.  Straight leg raise negative bilaterally.  Muscle strength 5 out of 5 for lower extremities and knee flexion, extension, hip flexion, extension ankle dorsi/plantarflexion.  No sensory deficits to all major nerve distributions lower extremities.  DTR symmetric and equal bilaterally.  Pedal pulses full and intact bilaterally.  Skin:    General: Skin is warm and dry.     Capillary Refill: Capillary refill takes less than 2 seconds.  Neurological:     Mental Status: She is alert.  Psychiatric:        Mood and Affect: Mood normal.     ED Results / Procedures / Treatments   Labs (all labs ordered are listed, but only abnormal results are displayed) Labs Reviewed  URINALYSIS, ROUTINE W REFLEX MICROSCOPIC - Abnormal; Notable for the following components:      Result Value   Hgb urine dipstick TRACE (*)    All other components within normal limits  URINALYSIS, MICROSCOPIC (REFLEX) - Abnormal; Notable for the following components:   Bacteria, UA RARE (*)    All other components within normal limits  URINE CULTURE  PREGNANCY, URINE    EKG None  Radiology No results found.  Procedures Procedures    Medications Ordered in ED Medications  ketorolac (TORADOL) 15 MG/ML injection 15 mg (15 mg Intramuscular Given 11/17/21 1011)    ED Course/ Medical Decision Making/ A&P                           Medical Decision Making Amount and/or Complexity of Data Reviewed Labs: ordered.  Risk Prescription drug management.   This patient presents to the ED for concern of back pain, this involves an extensive number of treatment options, and is a complaint that carries with it a high risk of complications and morbidity.  The differential diagnosis includes The emergent  differential diagnosis for back pain includes but is not limited to fracture, muscle strain, cauda equina, spinal stenosis. DDD, ankylosing spondylitis, acute ligamentous injury, disk herniation, spondylolisthesis, Epidural compression syndrome, metastatic cancer, transverse myelitis, vertebral osteomyelitis, diskitis, kidney stone, pyelonephritis, AAA, Perforated ulcer, Retrocecal appendicitis, pancreatitis, bowel obstruction, retroperitoneal hemorrhage or mass, meningitis.  Co morbidities that complicate the patient evaluation  See HPI   Additional history obtained:  Additional history obtained from EMR External records from outside source obtained and reviewed including urinalysis from 11//23   Lab Tests:  I Ordered, and personally interpreted labs.  The pertinent results include: UA significant for rare bacteria and  trace hemoglobin otherwise within normal range.  Urine pregnancy negative.   Imaging Studies ordered:  N/a   Cardiac Monitoring: / EKG:  The patient was maintained on a cardiac monitor.  I personally viewed and interpreted the cardiac monitored which showed an underlying rhythm of: Sinus rhythm   Consultations Obtained:  N/a   Problem List / ED Course / Critical interventions / Medication management  Back pain I ordered medication including Toradol for pain f  Reevaluation of the patient after these medicines showed that the patient improved I have reviewed the patients home medicines and have made adjustments as needed   Social Determinants of Health:  Denies tobacco, licit drug use   Test / Admission - Considered:  Lumbar strain Vitals signs within normal range and stable throughout visit. Laboratory studies significant for: see above Patient symptoms likely secondary to lumbar strain given area of tenderness as well as radiating type symptoms.  No definitive sciatica given negative straight leg raise.  Doubt nephrolithiasis given duration of  symptoms as well as reaffirming HPI.  Given patient's continued urinary symptoms and evidence of bacteriuria will treat with Keflex outpatient with urine culture pending.  Recommend follow-up with urology regarding symptoms if continued.  Patient's lumbar pain to be treated with ibuprofen and prednisone taper.  Recommend follow-up with PCP for further evaluation of back pain in 3 to 5 days.  Patient exhibiting no red flag signs of back pain so further work-up deemed unnecessary at this time.  No abdominal tenderness either.  Patient responded well to Toradol administered while in the emergency department.  Treatment plan discussed at length with patient she knowledge understanding was agreeable to said plan. Worrisome signs and symptoms were discussed with the patient, and the patient acknowledged understanding to return to the ED if noticed. Patient was stable upon discharge.          Final Clinical Impression(s) / ED Diagnoses Final diagnoses:  Strain of lumbar region, initial encounter    Rx / DC Orders ED Discharge Orders          Ordered    predniSONE (STERAPRED UNI-PAK 21 TAB) 10 MG (21) TBPK tablet  Daily        11/17/21 1124    cephALEXin (KEFLEX) 500 MG capsule  4 times daily        11/17/21 1124              Wilnette Kales, [place] 11/17/21 1254    Lennice Sites, DO 11/17/21 1305

## 2021-11-17 NOTE — ED Triage Notes (Signed)
Pt arrives pov, c/o RT lower back pain x 1 month radiating to RT leg, seen by UC and neg for UTI. Denies dysuria

## 2021-11-18 LAB — URINE CULTURE: Culture: <10000 — AB

## 2021-11-27 ENCOUNTER — Other Ambulatory Visit (HOSPITAL_COMMUNITY): Payer: Self-pay

## 2021-11-28 ENCOUNTER — Other Ambulatory Visit (HOSPITAL_COMMUNITY): Payer: Self-pay

## 2022-01-10 ENCOUNTER — Other Ambulatory Visit (HOSPITAL_BASED_OUTPATIENT_CLINIC_OR_DEPARTMENT_OTHER): Payer: Self-pay

## 2022-01-25 ENCOUNTER — Ambulatory Visit: Payer: Commercial Managed Care - HMO | Admitting: Family Medicine

## 2022-02-20 ENCOUNTER — Ambulatory Visit: Payer: Commercial Managed Care - HMO | Admitting: Family Medicine

## 2022-03-01 ENCOUNTER — Inpatient Hospital Stay: Payer: Medicaid Other | Admitting: Hematology & Oncology

## 2022-03-01 ENCOUNTER — Inpatient Hospital Stay: Payer: Medicaid Other

## 2022-03-08 ENCOUNTER — Other Ambulatory Visit (HOSPITAL_BASED_OUTPATIENT_CLINIC_OR_DEPARTMENT_OTHER): Payer: Self-pay

## 2022-04-16 ENCOUNTER — Other Ambulatory Visit (HOSPITAL_BASED_OUTPATIENT_CLINIC_OR_DEPARTMENT_OTHER): Payer: Self-pay

## 2022-04-23 ENCOUNTER — Other Ambulatory Visit (HOSPITAL_BASED_OUTPATIENT_CLINIC_OR_DEPARTMENT_OTHER): Payer: Self-pay

## 2022-05-03 ENCOUNTER — Other Ambulatory Visit (HOSPITAL_BASED_OUTPATIENT_CLINIC_OR_DEPARTMENT_OTHER): Payer: Self-pay

## 2022-05-06 ENCOUNTER — Other Ambulatory Visit (HOSPITAL_BASED_OUTPATIENT_CLINIC_OR_DEPARTMENT_OTHER): Payer: Self-pay

## 2022-05-06 ENCOUNTER — Other Ambulatory Visit: Payer: Self-pay

## 2022-05-06 ENCOUNTER — Inpatient Hospital Stay: Payer: Medicaid Other | Attending: Hematology & Oncology

## 2022-05-06 ENCOUNTER — Encounter: Payer: Self-pay | Admitting: Hematology & Oncology

## 2022-05-06 ENCOUNTER — Inpatient Hospital Stay (HOSPITAL_BASED_OUTPATIENT_CLINIC_OR_DEPARTMENT_OTHER): Payer: Medicaid Other | Admitting: Hematology & Oncology

## 2022-05-06 VITALS — BP 137/85 | HR 74 | Temp 98.4°F | Resp 17 | Ht 62.0 in | Wt 148.0 lb

## 2022-05-06 DIAGNOSIS — Z170 Estrogen receptor positive status [ER+]: Secondary | ICD-10-CM | POA: Insufficient documentation

## 2022-05-06 DIAGNOSIS — Z79899 Other long term (current) drug therapy: Secondary | ICD-10-CM | POA: Insufficient documentation

## 2022-05-06 DIAGNOSIS — C50911 Malignant neoplasm of unspecified site of right female breast: Secondary | ICD-10-CM | POA: Diagnosis present

## 2022-05-06 DIAGNOSIS — R350 Frequency of micturition: Secondary | ICD-10-CM

## 2022-05-06 DIAGNOSIS — Z79810 Long term (current) use of selective estrogen receptor modulators (SERMs): Secondary | ICD-10-CM | POA: Insufficient documentation

## 2022-05-06 LAB — CBC WITH DIFFERENTIAL (CANCER CENTER ONLY)
Abs Immature Granulocytes: 0.04 10*3/uL (ref 0.00–0.07)
Basophils Absolute: 0 10*3/uL (ref 0.0–0.1)
Basophils Relative: 0 %
Eosinophils Absolute: 0.1 10*3/uL (ref 0.0–0.5)
Eosinophils Relative: 1 %
HCT: 39.4 % (ref 36.0–46.0)
Hemoglobin: 13.2 g/dL (ref 12.0–15.0)
Immature Granulocytes: 0 %
Lymphocytes Relative: 30 %
Lymphs Abs: 3.7 10*3/uL (ref 0.7–4.0)
MCH: 32.3 pg (ref 26.0–34.0)
MCHC: 33.5 g/dL (ref 30.0–36.0)
MCV: 96.3 fL (ref 80.0–100.0)
Monocytes Absolute: 1.1 10*3/uL — ABNORMAL HIGH (ref 0.1–1.0)
Monocytes Relative: 9 %
Neutro Abs: 7.5 10*3/uL (ref 1.7–7.7)
Neutrophils Relative %: 60 %
Platelet Count: 228 10*3/uL (ref 150–400)
RBC: 4.09 MIL/uL (ref 3.87–5.11)
RDW: 13.2 % (ref 11.5–15.5)
WBC Count: 12.4 10*3/uL — ABNORMAL HIGH (ref 4.0–10.5)
nRBC: 0 % (ref 0.0–0.2)

## 2022-05-06 LAB — CMP (CANCER CENTER ONLY)
ALT: 7 U/L (ref 0–44)
AST: 11 U/L — ABNORMAL LOW (ref 15–41)
Albumin: 3.9 g/dL (ref 3.5–5.0)
Alkaline Phosphatase: 46 U/L (ref 38–126)
Anion gap: 8 (ref 5–15)
BUN: 9 mg/dL (ref 6–20)
CO2: 28 mmol/L (ref 22–32)
Calcium: 9.2 mg/dL (ref 8.9–10.3)
Chloride: 105 mmol/L (ref 98–111)
Creatinine: 0.96 mg/dL (ref 0.44–1.00)
GFR, Estimated: >60 mL/min (ref >60)
Glucose, Bld: 90 mg/dL (ref 70–99)
Potassium: 4 mmol/L (ref 3.5–5.1)
Sodium: 141 mmol/L (ref 135–145)
Total Bilirubin: 0.3 mg/dL (ref 0.3–1.2)
Total Protein: 6.5 g/dL (ref 6.5–8.1)

## 2022-05-06 MED ORDER — GABAPENTIN 300 MG PO CAPS
300.0000 mg | ORAL_CAPSULE | Freq: Three times a day (TID) | ORAL | 4 refills | Status: DC
  Filled 2022-05-06: qty 90, 30d supply, fill #0
  Filled 2022-06-07: qty 90, 30d supply, fill #1
  Filled 2022-07-07 – 2022-07-17 (×3): qty 90, 30d supply, fill #2
  Filled 2022-08-25: qty 90, 30d supply, fill #3
  Filled 2022-09-30: qty 90, 30d supply, fill #4

## 2022-05-06 MED ORDER — TAMOXIFEN CITRATE 20 MG PO TABS
20.0000 mg | ORAL_TABLET | Freq: Every day | ORAL | 4 refills | Status: DC
  Filled 2022-05-06 – 2022-06-07 (×2): qty 90, 90d supply, fill #0
  Filled 2022-09-30: qty 90, 90d supply, fill #1
  Filled 2022-12-30: qty 90, 90d supply, fill #2

## 2022-05-06 MED ORDER — TELMISARTAN 20 MG PO TABS
20.0000 mg | ORAL_TABLET | Freq: Every day | ORAL | 4 refills | Status: DC
  Filled 2022-05-06 – 2022-06-07 (×2): qty 30, 30d supply, fill #0
  Filled 2022-07-07 – 2022-07-17 (×3): qty 30, 30d supply, fill #1

## 2022-05-06 NOTE — Progress Notes (Signed)
Hematology and Oncology Follow Up Visit  Evelyn Cantrell [id number] 07-30-1975 47 y.o. 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 daily-started on 06/2018     Interim History:  Evelyn Cantrell is back for second office visit.  We first saw her back in July.  At that time, she just moved up here from [place].  She is doing pretty well.  She has had no problems since we last saw her.  She has had no problems with the tamoxifen.  There is been no leg pain or swelling.  She has had no ocular issues.  She has had no cough or shortness of breath.  There has been no change in bowel or bladder habits.  She has had no headache.  She is complaining of some urinary pain.  She says she went to the emergency room recently.  I think she had possible pneumonia.  I think everything was negative.  However, she is complaining of pain in the right flank area.  There is little bit of dysuria.  She has had no fever.  Overall, I would say performance status is probably ECOG 0.  Medications:  Current Outpatient Medications:  .  albuterol (VENTOLIN HFA) 108 (90 Base) MCG/ACT inhaler, Inhale 1-2 puffs into the lungs every 6 (six) hours as needed for wheezing or shortness of breath., Disp: 18 g, Rfl: 0 .  citalopram (CELEXA) 10 MG tablet, Take by mouth., Disp: , Rfl:  .  fluticasone (FLONASE) 50 MCG/ACT nasal spray, Place into the nose., Disp: , Rfl:  .  hydrOXYzine (ATARAX) 25 MG tablet, Take 25 mg by mouth daily as needed., Disp: , Rfl:  .  nicotine (NICODERM CQ) 21 mg/24hr patch, Place 1 patch (21 mg total) onto the skin daily., Disp: 28 patch, Rfl: 0 .  predniSONE (STERAPRED UNI-PAK 21 TAB) 10 MG (21) TBPK tablet, Take by mouth daily. Take 6 tabs by mouth daily  for 2 days, then 5 tabs for 2 days, then 4 tabs for 2 days, then 3 tabs for 2 days, 2 tabs for 2 days, then 1 tab  by mouth daily for 2 days, Disp: 42 tablet, Rfl: 0 .  tamoxifen (NOLVADEX) 20 MG tablet, Take 1 tablet (20 mg total) by mouth daily., Disp: 90 tablet, Rfl: 4 .  telmisartan (MICARDIS) 20 MG tablet, Take 1 tablet (20 mg total) by mouth daily., Disp: 30 tablet, Rfl: 4 .  zolpidem (AMBIEN) 10 MG tablet, Take 10 mg by mouth at bedtime., Disp: , Rfl:   Allergies: No Known Allergies  Past Medical History, Surgical history, Social history, and Family History were reviewed and updated.  Review of Systems: Review of Systems  Constitutional: Negative.   HENT:  Negative.    Eyes: Negative.   Respiratory: Negative.    Cardiovascular: Negative.   Gastrointestinal: Negative.   Endocrine: Negative.   Genitourinary:  Positive for dysuria.   Musculoskeletal:  Positive for flank pain.  Neurological: Negative.   Hematological: Negative.   Psychiatric/Behavioral: Negative.      Physical Exam:  height is 5\' 2"  (1.575 m) and weight is 148 lb (67.1 kg). Her oral temperature is 98.4 F (36.9 C). Her blood pressure is 137/85 and her pulse is 74. Her respiration is 17 and oxygen saturation is 100%.   Wt Readings from Last 3 Encounters:  05/06/22 148 lb (67.1 kg)  11/17/21 143 lb (  64.9 kg)  10/26/21 143 lb 1.9 oz (64.9 kg)    Physical Exam Vitals reviewed.  Constitutional:      Comments: Breast exam shows left breast with no masses, edema or erythema.  There is no left axillary adenopathy.  Right breast shows well-healed lumpectomy at the 5 o'clock position.  She has a well-healed surgical scar.  She has no areola swelling.  There is no distinct mass in the right breast.  There is no right axillary adenopathy.  HENT:     Head: Normocephalic and atraumatic.  Eyes:     Pupils: Pupils are equal, round, and reactive to light.  Cardiovascular:     Rate and Rhythm: Normal rate and regular rhythm.     Heart sounds: Normal heart sounds.  Pulmonary:     Effort: Pulmonary effort is normal.     Breath  sounds: Normal breath sounds.  Abdominal:     General: Bowel sounds are normal.     Palpations: Abdomen is soft.  Musculoskeletal:        General: No tenderness or deformity. Normal range of motion.     Cervical back: Normal range of motion.  Lymphadenopathy:     Cervical: No cervical adenopathy.  Skin:    General: Skin is warm and dry.     Findings: No erythema or rash.  Neurological:     Mental Status: She is alert and oriented to person, place, and time.  Psychiatric:        Behavior: Behavior normal.        Thought Content: Thought content normal.        Judgment: Judgment normal.     Lab Results  Component Value Date   WBC 12.4 (H) 05/06/2022   HGB 13.2 05/06/2022   HCT 39.4 05/06/2022   MCV 96.3 05/06/2022   PLT 228 05/06/2022     Chemistry      Component Value Date/Time   NA 141 05/06/2022 0811   K 4.0 05/06/2022 0811   CL 105 05/06/2022 0811   CO2 28 05/06/2022 0811   BUN 9 05/06/2022 0811   CREATININE 0.96 05/06/2022 0811      Component Value Date/Time   CALCIUM 9.2 05/06/2022 0811   ALKPHOS 46 05/06/2022 0811   AST 11 (L) 05/06/2022 0811   ALT 7 05/06/2022 0811   BILITOT 0.3 05/06/2022 0811       Impression and Plan: Evelyn Cantrell is a very charming 47 year old premenopausal African-American female with a early stage-stage Ia-breast cancer.  Of note when we last saw her, her estradiol level was 76.  We will continue her on the tamoxifen.  I sent in another prescription for her.  We will see what her urinalysis shows.  If there is any hint of a infection, we will put her on some antibiotic.  Otherwise, I think everything looks quite well.  I think the risk of recurrence is going to be less than 10%.  We will plan to get her back in another 4 months now.  Is been about 3 and half years that she has had her surgery.   Josph Macho, MD 4/29/20248:58 AM

## 2022-05-10 LAB — ESTRADIOL, ULTRA SENS: Estradiol, Sensitive: 4.3 pg/mL

## 2022-06-07 ENCOUNTER — Other Ambulatory Visit (HOSPITAL_BASED_OUTPATIENT_CLINIC_OR_DEPARTMENT_OTHER): Payer: Self-pay

## 2022-06-10 ENCOUNTER — Other Ambulatory Visit: Payer: Self-pay | Admitting: Hematology & Oncology

## 2022-06-10 ENCOUNTER — Other Ambulatory Visit (HOSPITAL_BASED_OUTPATIENT_CLINIC_OR_DEPARTMENT_OTHER): Payer: Self-pay

## 2022-06-10 DIAGNOSIS — C50911 Malignant neoplasm of unspecified site of right female breast: Secondary | ICD-10-CM

## 2022-06-27 ENCOUNTER — Encounter: Payer: Self-pay | Admitting: Hematology & Oncology

## 2022-06-28 ENCOUNTER — Ambulatory Visit
Admission: RE | Admit: 2022-06-28 | Discharge: 2022-06-28 | Disposition: A | Discharge status: Home / Self Care | Payer: Medicaid Other | Source: Ambulatory Visit | Attending: Hematology & Oncology | Admitting: Hematology & Oncology

## 2022-06-28 ENCOUNTER — Encounter: Payer: Self-pay | Admitting: Pain Medicine

## 2022-06-28 DIAGNOSIS — C50911 Malignant neoplasm of unspecified site of right female breast: Secondary | ICD-10-CM

## 2022-06-28 DIAGNOSIS — N644 Mastodynia: Secondary | ICD-10-CM | POA: Diagnosis not present

## 2022-06-28 HISTORY — DX: Personal history of irradiation: Z92.3

## 2022-07-08 ENCOUNTER — Other Ambulatory Visit: Payer: Self-pay

## 2022-07-08 ENCOUNTER — Encounter: Payer: Self-pay | Admitting: *Deleted

## 2022-07-08 ENCOUNTER — Other Ambulatory Visit (HOSPITAL_BASED_OUTPATIENT_CLINIC_OR_DEPARTMENT_OTHER): Payer: Self-pay

## 2022-07-08 ENCOUNTER — Inpatient Hospital Stay: Payer: Medicaid Other | Attending: Hematology & Oncology

## 2022-07-08 ENCOUNTER — Inpatient Hospital Stay (HOSPITAL_BASED_OUTPATIENT_CLINIC_OR_DEPARTMENT_OTHER): Payer: Medicaid Other | Admitting: Hematology & Oncology

## 2022-07-08 ENCOUNTER — Encounter: Payer: Self-pay | Admitting: Hematology & Oncology

## 2022-07-08 VITALS — BP 138/85 | HR 66 | Temp 98.7°F | Resp 18 | Ht 62.0 in | Wt 146.0 lb

## 2022-07-08 DIAGNOSIS — Z79899 Other long term (current) drug therapy: Secondary | ICD-10-CM | POA: Insufficient documentation

## 2022-07-08 DIAGNOSIS — C50911 Malignant neoplasm of unspecified site of right female breast: Secondary | ICD-10-CM

## 2022-07-08 DIAGNOSIS — Z170 Estrogen receptor positive status [ER+]: Secondary | ICD-10-CM | POA: Diagnosis not present

## 2022-07-08 LAB — CBC WITH DIFFERENTIAL (CANCER CENTER ONLY)
Abs Immature Granulocytes: 0.02 10*3/uL (ref 0.00–0.07)
Basophils Absolute: 0 10*3/uL (ref 0.0–0.1)
Basophils Relative: 1 %
Eosinophils Absolute: 0.1 10*3/uL (ref 0.0–0.5)
Eosinophils Relative: 2 %
HCT: 38.6 % (ref 36.0–46.0)
Hemoglobin: 12.7 g/dL (ref 12.0–15.0)
Immature Granulocytes: 0 %
Lymphocytes Relative: 54 %
Lymphs Abs: 3.5 10*3/uL (ref 0.7–4.0)
MCH: 32.3 pg (ref 26.0–34.0)
MCHC: 32.9 g/dL (ref 30.0–36.0)
MCV: 98.2 fL (ref 80.0–100.0)
Monocytes Absolute: 0.5 10*3/uL (ref 0.1–1.0)
Monocytes Relative: 8 %
Neutro Abs: 2.2 10*3/uL (ref 1.7–7.7)
Neutrophils Relative %: 35 %
Platelet Count: 204 10*3/uL (ref 150–400)
RBC: 3.93 MIL/uL (ref 3.87–5.11)
RDW: 13.2 % (ref 11.5–15.5)
WBC Count: 6.4 10*3/uL (ref 4.0–10.5)
nRBC: 0 % (ref 0.0–0.2)

## 2022-07-08 LAB — CMP (CANCER CENTER ONLY)
ALT: 5 U/L (ref 0–44)
AST: 13 U/L — ABNORMAL LOW (ref 15–41)
Albumin: 4.3 g/dL (ref 3.5–5.0)
Alkaline Phosphatase: 50 U/L (ref 38–126)
Anion gap: 7 (ref 5–15)
BUN: 8 mg/dL (ref 6–20)
CO2: 27 mmol/L (ref 22–32)
Calcium: 9.5 mg/dL (ref 8.9–10.3)
Chloride: 106 mmol/L (ref 98–111)
Creatinine: 1.09 mg/dL — ABNORMAL HIGH (ref 0.44–1.00)
GFR, Estimated: >60 mL/min (ref >60)
Glucose, Bld: 84 mg/dL (ref 70–99)
Potassium: 4.5 mmol/L (ref 3.5–5.1)
Sodium: 140 mmol/L (ref 135–145)
Total Bilirubin: 0.5 mg/dL (ref 0.3–1.2)
Total Protein: 7 g/dL (ref 6.5–8.1)

## 2022-07-08 NOTE — Progress Notes (Signed)
Hematology and Oncology Follow Up Visit  Evelyn Cantrell [id number] 1975/06/06 47 y.o. 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 daily-started on 06/2018     Interim History:  Ms. Evelyn Cantrell is back for her follow-up.  She did have a mammogram that was done.  This was done back about 2 weeks ago.  Everything was fine on the mammogram.  The pain in the right breast is a little bit better.  I do not know if this might be from the gabapentin that she is on.  She is working.  She is working full-time.  She has had no problems with nausea or vomiting.  She has had no cough or shortness of breath.  She has had no change in bowel or bladder habits.  Overall, I would say that her performance status is probably ECOG 1.    Medications:  Current Outpatient Medications:    albuterol (VENTOLIN HFA) 108 (90 Base) MCG/ACT inhaler, Inhale 1-2 puffs into the lungs every 6 (six) hours as needed for wheezing or shortness of breath., Disp: 18 g, Rfl: 0   citalopram (CELEXA) 10 MG tablet, Take by mouth., Disp: , Rfl:    fluticasone (FLONASE) 50 MCG/ACT nasal spray, Place into the nose., Disp: , Rfl:    gabapentin (NEURONTIN) 300 MG capsule, Take 1 capsule (300 mg total) by mouth 3 (three) times daily., Disp: 90 capsule, Rfl: 4   hydrOXYzine (ATARAX) 25 MG tablet, Take 25 mg by mouth daily as needed., Disp: , Rfl:    nicotine (NICODERM CQ) 21 mg/24hr patch, Place 1 patch (21 mg total) onto the skin daily., Disp: 28 patch, Rfl: 0   tamoxifen (NOLVADEX) 20 MG tablet, Take 1 tablet (20 mg total) by mouth daily., Disp: 90 tablet, Rfl: 4   telmisartan (MICARDIS) 20 MG tablet, Take 1 tablet (20 mg total) by mouth daily., Disp: 30 tablet, Rfl: 4  Allergies: No Known Allergies  Past Medical History, Surgical history, Social history, and Family History were reviewed and  updated.  Review of Systems: Review of Systems  Constitutional: Negative.   HENT:  Negative.    Eyes: Negative.   Respiratory: Negative.    Cardiovascular: Negative.   Gastrointestinal: Negative.   Endocrine: Negative.   Genitourinary:  Positive for dysuria.   Musculoskeletal:  Positive for flank pain.  Neurological: Negative.   Hematological: Negative.   Psychiatric/Behavioral: Negative.      Physical Exam:  height is 5\' 2"  (1.575 m) and weight is 146 lb (66.2 kg). Her oral temperature is 98.7 F (37.1 C). Her blood pressure is 138/85 and her pulse is 66. Her respiration is 18 and oxygen saturation is 100%.   Wt Readings from Last 3 Encounters:  07/08/22 146 lb (66.2 kg)  05/06/22 148 lb (67.1 kg)  11/17/21 143 lb (64.9 kg)    Physical Exam Vitals reviewed.  Constitutional:      Comments: Breast exam shows left breast with no masses, edema or erythema.  There is no left axillary adenopathy.  Right breast shows well-healed lumpectomy at the 5 o'clock position.  She has a well-healed surgical scar.  She has no areola swelling.  There is no distinct mass in the right breast.  There is no right axillary adenopathy.  HENT:     Head: Normocephalic and atraumatic.  Eyes:     Pupils: Pupils are equal, round, and reactive to  light.  Cardiovascular:     Rate and Rhythm: Normal rate and regular rhythm.     Heart sounds: Normal heart sounds.  Pulmonary:     Effort: Pulmonary effort is normal.     Breath sounds: Normal breath sounds.  Abdominal:     General: Bowel sounds are normal.     Palpations: Abdomen is soft.  Musculoskeletal:        General: No tenderness or deformity. Normal range of motion.     Cervical back: Normal range of motion.  Lymphadenopathy:     Cervical: No cervical adenopathy.  Skin:    General: Skin is warm and dry.     Findings: No erythema or rash.  Neurological:     Mental Status: She is alert and oriented to person, place, and time.  Psychiatric:         Behavior: Behavior normal.        Thought Content: Thought content normal.        Judgment: Judgment normal.     Lab Results  Component Value Date   WBC 6.4 07/08/2022   HGB 12.7 07/08/2022   HCT 38.6 07/08/2022   MCV 98.2 07/08/2022   PLT 204 07/08/2022     Chemistry      Component Value Date/Time   NA 140 07/08/2022 1230   K 4.5 07/08/2022 1230   CL 106 07/08/2022 1230   CO2 27 07/08/2022 1230   BUN 8 07/08/2022 1230   CREATININE 1.09 (H) 07/08/2022 1230      Component Value Date/Time   CALCIUM 9.5 07/08/2022 1230   ALKPHOS 50 07/08/2022 1230   AST 13 (L) 07/08/2022 1230   ALT 5 07/08/2022 1230   BILITOT 0.5 07/08/2022 1230       Impression and Plan: Ms. Evelyn Cantrell is a very charming 47 year old premenopausal African-American female with a early stage-stage Ia-breast cancer.  Of note when we last saw her, her estradiol level was 76.  So far, everything seems to be doing pretty well.  I am glad that the mammogram was done.  She does not need another mammogram for another year.  Her blood pressure is certainly better.  The Micardis is helping that.  For right now, I think we can probably get her back in about 3 months now.  We will get her through the Summertime.  Josph Macho, MD 7/1/20241:23 PM

## 2022-07-09 LAB — LUTEINIZING HORMONE: LH: 22.3 m[IU]/mL

## 2022-07-09 LAB — FOLLICLE STIMULATING HORMONE: FSH: 31.6 m[IU]/mL

## 2022-07-11 LAB — ESTRADIOL, ULTRA SENS: Estradiol, Sensitive: 113.6 pg/mL

## 2022-07-16 ENCOUNTER — Other Ambulatory Visit (HOSPITAL_BASED_OUTPATIENT_CLINIC_OR_DEPARTMENT_OTHER): Payer: Self-pay

## 2022-07-17 ENCOUNTER — Encounter: Payer: Self-pay | Admitting: Family Medicine

## 2022-07-17 ENCOUNTER — Ambulatory Visit (INDEPENDENT_AMBULATORY_CARE_PROVIDER_SITE_OTHER): Payer: Medicaid Other | Admitting: Family Medicine

## 2022-07-17 ENCOUNTER — Other Ambulatory Visit (HOSPITAL_BASED_OUTPATIENT_CLINIC_OR_DEPARTMENT_OTHER): Payer: Self-pay

## 2022-07-17 VITALS — BP 110/75 | HR 84 | Ht 62.0 in | Wt 144.0 lb

## 2022-07-17 DIAGNOSIS — I10 Essential (primary) hypertension: Secondary | ICD-10-CM

## 2022-07-17 DIAGNOSIS — Z0000 Encounter for general adult medical examination without abnormal findings: Secondary | ICD-10-CM

## 2022-07-17 DIAGNOSIS — F419 Anxiety disorder, unspecified: Secondary | ICD-10-CM

## 2022-07-17 DIAGNOSIS — J4520 Mild intermittent asthma, uncomplicated: Secondary | ICD-10-CM | POA: Diagnosis not present

## 2022-07-17 DIAGNOSIS — C50911 Malignant neoplasm of unspecified site of right female breast: Secondary | ICD-10-CM | POA: Diagnosis not present

## 2022-07-17 DIAGNOSIS — F32A Depression, unspecified: Secondary | ICD-10-CM

## 2022-07-17 DIAGNOSIS — J309 Allergic rhinitis, unspecified: Secondary | ICD-10-CM

## 2022-07-17 MED ORDER — TELMISARTAN 20 MG PO TABS
20.0000 mg | ORAL_TABLET | Freq: Every day | ORAL | 4 refills | Status: DC
  Filled 2022-07-17: qty 30, 30d supply, fill #0
  Filled 2022-08-25 – 2022-10-03 (×3): qty 30, 30d supply, fill #1
  Filled 2022-11-21: qty 30, 30d supply, fill #2
  Filled 2022-12-30: qty 30, 30d supply, fill #3

## 2022-07-17 MED ORDER — HYDROXYZINE HCL 25 MG PO TABS
25.0000 mg | ORAL_TABLET | Freq: Every day | ORAL | 5 refills | Status: DC | PRN
  Filled 2022-07-17: qty 30, 30d supply, fill #0

## 2022-07-17 MED ORDER — ALBUTEROL SULFATE HFA 108 (90 BASE) MCG/ACT IN AERS
1.0000 | INHALATION_SPRAY | Freq: Four times a day (QID) | RESPIRATORY_TRACT | 0 refills | Status: AC | PRN
  Filled 2022-07-17: qty 18, 30d supply, fill #0

## 2022-07-17 MED ORDER — CITALOPRAM HYDROBROMIDE 10 MG PO TABS
10.0000 mg | ORAL_TABLET | Freq: Every day | ORAL | 1 refills | Status: DC
  Filled 2022-07-17: qty 90, 90d supply, fill #0

## 2022-07-17 MED ORDER — FLUTICASONE PROPIONATE 50 MCG/ACT NA SUSP
2.0000 | Freq: Every day | NASAL | 5 refills | Status: DC
  Filled 2022-07-17: qty 16, 30d supply, fill #0
  Filled 2022-08-25 – 2022-09-30 (×3): qty 16, 30d supply, fill #1
  Filled 2022-11-21: qty 16, 30d supply, fill #2
  Filled 2022-12-30: qty 16, 30d supply, fill #3
  Filled 2023-03-07 – 2023-05-09 (×2): qty 16, 30d supply, fill #5

## 2022-07-17 NOTE — Assessment & Plan Note (Signed)
Hypertension: Well controlled on Telmesartan. -Continue Telmesartan. -Continue lifestyle measures and monitoring

## 2022-07-17 NOTE — Assessment & Plan Note (Signed)
Asthma: Well controlled, occasional use of Albuterol. -Continue current management.

## 2022-07-17 NOTE — Assessment & Plan Note (Signed)
Depression and Anxiety: High scores on depression and anxiety assessments. Previously on Citalopram and Hydroxyzine with some improvement. Currently off medications. No SI/HI. -Restart Citalopram and Hydroxyzine. -Follow up in 6 weeks to assess response to medications.

## 2022-07-17 NOTE — Progress Notes (Signed)
New Patient Office Visit  Subjective    Patient ID: Evelyn Cantrell, female    DOB: 02-Sep-1975  Age: 47 y.o. MRN: [id number]  CC:  Chief Complaint  Patient presents with   Establish Care    HPI Evelyn Cantrell presents to establish care. She was previously seeing [facility].    Discussed the use of AI scribe software for clinical note transcription with the patient, who gave verbal consent to proceed.  History of Present Illness   The patient, previously under the care of [facility], sought a new primary care provider due to dissatisfaction with the previous care received. She reported a history of depression and anxiety, which had been managed with Citalopram and Hydroxyzine. However, due to a lapse in care, these medications had not been refilled recently, leading to a significant increase in mood symptoms.  The patient also has a history of breast cancer, diagnosed in 2020, for which she underwent lumpectomy and radiation therapy. She continues to follow up with Dr. Myna Hidalgo for this condition. She is currently on Tamoxifen for the same. The patient reported nerve pain in the arm, believed to be a result of the radiation therapy and surgical biopsy. This pain is managed with Gabapentin.  The patient also has a history of asthma, which is well-controlled with Albuterol, used infrequently and primarily in response to exposure to dust and dirt. She also has allergies, managed with Flonase, and hypertension, managed with Telmesartan.  The patient reported a recent issue with a tingling sensation in her back, which moves around. This symptom began after the completion of a course of antibiotics for a tooth infection. Despite multiple visits to urgent care and the emergency room, no definitive diagnosis has been made for this symptom. The patient also reported a history of smoking, but is currently trying to quit with the aid of patches.               Outpatient Encounter  Medications as of 07/17/2022  Medication Sig   gabapentin (NEURONTIN) 300 MG capsule Take 1 capsule (300 mg total) by mouth 3 (three) times daily.   nicotine (NICODERM CQ) 21 mg/24hr patch Place 1 patch (21 mg total) onto the skin daily.   tamoxifen (NOLVADEX) 20 MG tablet Take 1 tablet (20 mg total) by mouth daily.   [DISCONTINUED] telmisartan (MICARDIS) 20 MG tablet Take 1 tablet (20 mg total) by mouth daily.   albuterol (VENTOLIN HFA) 108 (90 Base) MCG/ACT inhaler Inhale 1-2 puffs into the lungs every 6 (six) hours as needed for wheezing or shortness of breath.   citalopram (CELEXA) 10 MG tablet Take 1 tablet (10 mg total) by mouth daily.   fluticasone (FLONASE) 50 MCG/ACT nasal spray Place 2 sprays into both nostrils daily.   hydrOXYzine (ATARAX) 25 MG tablet Take 1 tablet (25 mg total) by mouth daily as needed.   telmisartan (MICARDIS) 20 MG tablet Take 1 tablet (20 mg total) by mouth daily.   [DISCONTINUED] albuterol (VENTOLIN HFA) 108 (90 Base) MCG/ACT inhaler Inhale 1-2 puffs into the lungs every 6 (six) hours as needed for wheezing or shortness of breath. (Patient not taking: Reported on 07/17/2022)   [DISCONTINUED] citalopram (CELEXA) 10 MG tablet Take by mouth. (Patient not taking: Reported on 07/17/2022)   [DISCONTINUED] fluticasone (FLONASE) 50 MCG/ACT nasal spray Place into the nose. (Patient not taking: Reported on 07/17/2022)   [DISCONTINUED] hydrOXYzine (ATARAX) 25 MG tablet Take 25 mg by mouth daily as needed. (Patient not taking: Reported on 07/17/2022)  No facility-administered encounter medications on file as of 07/17/2022.    Past Medical History:  Diagnosis Date   Asthma    Cancer (HCC)    COVID-19 virus infection 12/2019   Depression    Hypertension    Overweight (BMI 25.0-29.9)    Personal history of radiation therapy    Stage I breast cancer, right (HCC) 2020    Past Surgical History:  Procedure Laterality Date   BREAST LUMPECTOMY Right 2020    Family History   Problem Relation Age of Onset   Hypertension Mother    Hypertension Father    Cancer Brother        in stomach   Breast cancer Maternal Grandmother    Breast cancer Paternal Aunt    Breast cancer Cousin    Breast cancer Cousin    Breast cancer Cousin     Social History   Socioeconomic History   Marital status: Single    Spouse name: Not on file   Number of children: Not on file   Years of education: Not on file   Highest education level: Not on file  Occupational History   Not on file  Tobacco Use   Smoking status: Every Day    Packs/day: .5    Types: Cigarettes   Smokeless tobacco: Never  Vaping Use   Vaping Use: Never used  Substance and Sexual Activity   Alcohol use: Yes    Comment: occasional wine.   Drug use: No   Sexual activity: Not Currently    Partners: Male    Birth control/protection: Condom  Other Topics Concern   Not on file  Social History Narrative   Not on file   Social Determinants of Health   Financial Resource Strain: Not on file  Food Insecurity: Not on file  Transportation Needs: Not on file  Physical Activity: Not on file  Stress: Not on file  Social Connections: Not on file  Intimate Partner Violence: Not on file    ROS All review of systems negative except what is listed in the HPI      Objective    BP 110/75   Pulse 84   Ht 5\' 2"  (1.575 m)   Wt 144 lb (65.3 kg)   SpO2 100%   BMI 26.34 kg/m   Physical Exam Vitals reviewed.  Constitutional:      Appearance: Normal appearance.  HENT:     Head: Normocephalic and atraumatic.  Cardiovascular:     Rate and Rhythm: Normal rate and regular rhythm.     Pulses: Normal pulses.     Heart sounds: Normal heart sounds.  Pulmonary:     Effort: Pulmonary effort is normal.     Breath sounds: Normal breath sounds.  Abdominal:     General: Abdomen is flat.     Palpations: Abdomen is soft. There is no mass.     Tenderness: There is no abdominal tenderness. There is no right CVA  tenderness, left CVA tenderness, guarding or rebound.  Skin:    General: Skin is warm and dry.  Neurological:     Mental Status: She is alert and oriented to person, place, and time.  Psychiatric:        Mood and Affect: Mood normal.        Behavior: Behavior normal.        Thought Content: Thought content normal.        Judgment: Judgment normal.  Assessment & Plan:   Problem List Items Addressed This Visit     Asthma (Chronic)    Asthma: Well controlled, occasional use of Albuterol. -Continue current management.      Relevant Medications   albuterol (VENTOLIN HFA) 108 (90 Base) MCG/ACT inhaler   Stage I breast cancer, right (HCC) (Chronic)    Diagnosed in 2020, treated with radiation and lumpectomy. Currently on Tamoxifen. Following with oncology .Nerve Pain: Experiencing nerve pain in arm post-radiation and surgery. Currently on Gabapentin. -Continue Gabapentin as prescribed.      Anxiety and depression (Chronic)    Depression and Anxiety: High scores on depression and anxiety assessments. Previously on Citalopram and Hydroxyzine with some improvement. Currently off medications. No SI/HI. -Restart Citalopram and Hydroxyzine. -Follow up in 6 weeks to assess response to medications.      Relevant Medications   citalopram (CELEXA) 10 MG tablet   hydrOXYzine (ATARAX) 25 MG tablet   Primary hypertension (Chronic)    Hypertension: Well controlled on Telmesartan. -Continue Telmesartan. -Continue lifestyle measures and monitoring       Relevant Medications   telmisartan (MICARDIS) 20 MG tablet   Allergic rhinitis (Chronic)    Stable on current regimen.       Relevant Medications   fluticasone (FLONASE) 50 MCG/ACT nasal spray   Other Visit Diagnoses     Encounter for medical examination to establish care    -  Primary   Relevant Orders   Ambulatory referral to Obstetrics / Gynecology       Return in about 6 weeks (around 08/28/2022), or if  symptoms worsen or fail to improve, for mood f/u.   Clayborne Dana, NP

## 2022-07-17 NOTE — Patient Instructions (Signed)
Thank you for choosing [facility] for your Primary Care needs. I am excited for the opportunity to partner with you to meet your health care goals. It was a pleasure meeting you today!  Information on diet, exercise, and health maintenance recommendations are listed below. This is information to help you be sure you are on track for optimal health and monitoring.   Please look over this and let us know if you have any questions or if you have completed any of the health maintenance outside of [facility] so that we can be sure your records are up to date.  ___________________________________________________________  MyChart:  For all urgent or time sensitive needs we ask that you please call the office to avoid delays. Our number is [phone]. MyChart is not constantly monitored and due to the large volume of messages a day, replies may take up to 72 business hours.  MyChart Policy: MyChart allows for you to see your visit notes, after visit summary, provider recommendations, lab and tests results, make an appointment, request refills, and contact your provider or the office for non-urgent questions or concerns. Providers are seeing patients during normal business hours and do not have built in time to review MyChart messages.  We ask that you allow a minimum of 3 business days for responses to MyChart messages. For this reason, please do not send urgent requests through MyChart. Please call the office at [phone]. New and ongoing conditions may require a visit. We have virtual and in-person visits available for your convenience.  Complex MyChart concerns may require a visit. Your provider may request you schedule a virtual or in-person visit to ensure we are providing the best care possible. MyChart messages sent after 11:00 AM on Friday will not be received by the provider until Monday morning.    Lab and Test Results: You will receive your lab and test  results on MyChart as soon as they are completed and results have been sent by the lab or testing facility. Due to this service, you will receive your results BEFORE your provider.  I review lab and test results each morning prior to seeing patients. Some results require collaboration with other providers to ensure you are receiving the most appropriate care. For this reason, we ask that you please allow a minimum of 3-5 business days from the time that ALL results have been received for your provider to receive and review lab and test results and contact you about these.  Most lab and test result comments from the provider will be sent through MyChart. Your provider may recommend changes to the plan of care, follow-up visits, repeat testing, ask questions, or request an office visit to discuss these results. You may reply directly to this message or call the office to provide information for the provider or set up an appointment. In some instances, you will be called with test results and recommendations. Please let us know if this is preferred and we will make note of this in your chart to provide this for you.    If you have not heard a response to your lab or test results in 5 business days from all results returning to MyChart, please call the office to let us know. We ask that you please avoid calling prior to this time unless there is an emergent concern. Due to high call volumes, this can delay the resulting process.  After Hours: For all non-emergency after hours needs, please   call the office at [phone] and select the option to reach the on-call  service. On-call services are shared between multiple [facility] offices and therefore it will not be possible to speak directly with your provider. On-call providers may provide medical advice and recommendations, but are unable to provide refills for maintenance medications.  For all emergency or urgent medical needs after normal business hours, we  recommend that you seek care at the closest Urgent Care or Emergency Department to ensure appropriate treatment in a timely manner.  [facility] has a 24 hour emergency room located on the ground floor for your convenience.   Urgent Concerns During the Business Day Providers are seeing patients from 8AM to 5PM with a busy schedule and are most often not able to respond to non-urgent calls until the end of the day or the next business day. If you should have URGENT concerns during the day, please call and speak to the nurse or schedule a same day appointment so that we can address your concern without delay.   Thank you, again, for choosing me as your health care partner. I appreciate your trust and look forward to learning more about you!   Jawon Dipiero B. Baylee Mccorkel, DNP, FNP-C  ___________________________________________________________  Health Maintenance Recommendations Screening Testing Mammogram Every 1-2 years based on history and risk factors Starting at age 50 Pap Smear Ages 21-39 every 3 years Ages 30-65 every 5 years with HPV testing More frequent testing may be required based on results and history Colon Cancer Screening Every 1-10 years based on test performed, risk factors, and history Starting at age 45 Bone Density Screening Every 2-10 years based on history Starting at age 65 for women Recommendations for men differ based on medication usage, history, and risk factors AAA Screening One time ultrasound Men 65-75 years old who have ever smoked Lung Cancer Screening Low Dose Lung CT every 12 months Age 50-80 years with a 20 pack-year smoking history who still smoke or who have quit within the last 15 years  Screening Labs Routine  Labs: Complete Blood Count (CBC), Complete Metabolic Panel (CMP), Cholesterol (Lipid Panel) Every 6-12 months based on history and medications May be recommended more frequently based on current conditions or previous results Hemoglobin  A1c Lab Every 3-12 months based on history and previous results Starting at age 45 or earlier with diagnosis of diabetes, high cholesterol, BMI >26, and/or risk factors Frequent monitoring for patients with diabetes to ensure blood sugar control Thyroid Panel  Every 6 months based on history, symptoms, and risk factors May be repeated more often if on medication HIV One time testing for all patients 13 and older May be repeated more frequently for patients with increased risk factors or exposure Hepatitis C One time testing for all patients 18 and older May be repeated more frequently for patients with increased risk factors or exposure Gonorrhea, Chlamydia Every 12 months for all sexually active persons 13-24 years Additional monitoring may be recommended for those who are considered high risk or who have symptoms PSA Men 40-54 years old with risk factors Additional screening may be recommended from age 55-69 based on risk factors, symptoms, and history  Vaccine Recommendations Tetanus Booster All adults every 10 years Flu Vaccine All patients 6 months and older every year COVID Vaccine All patients 12 years and older Initial dosing with booster May recommend additional booster based on age and health history HPV Vaccine 2 doses all patients age 9-26 Dosing may be considered   for patients over 26 Shingles Vaccine (Shingrix) 2 doses all adults 50 years and older Pneumonia (Pneumovax 23) All adults 65 years and older May recommend earlier dosing based on health history Pneumonia (Prevnar 13) All adults 65 years and older Dosed 1 year after Pneumovax 23 Pneumonia (Prevnar 20) All adults 65 years and older (adults 19-64 with certain conditions or risk factors) 1 dose  For those who have not received Prevnar 13 vaccine previously   Additional Screening, Testing, and Vaccinations may be recommended on an individualized basis based on family history, health history, risk  factors, and/or exposure.  __________________________________________________________  Diet Recommendations for All Patients  I recommend that all patients maintain a diet low in saturated fats, carbohydrates, and cholesterol. While this can be challenging at first, it is not impossible and small changes can make big differences.  Things to try: Decreasing the amount of soda, sweet tea, and/or juice to one or less per day and replace with water While water is always the first choice, if you do not like water you may consider adding a water additive without sugar to improve the taste other sugar free drinks Replace potatoes with a brightly colored vegetable  Use healthy oils, such as canola oil or olive oil, instead of butter or hard margarine Limit your bread intake to two pieces or less a day Replace regular pasta with low carb pasta options Bake, broil, or grill foods instead of frying Monitor portion sizes  Eat smaller, more frequent meals throughout the day instead of large meals  An important thing to remember is, if you love foods that are not great for your health, you don't have to give them up completely. Instead, allow these foods to be a reward when you have done well. Allowing yourself to still have special treats every once in a while is a nice way to tell yourself thank you for working hard to keep yourself healthy.   Also remember that every day is a new day. If you have a bad day and "fall off the wagon", you can still climb right back up and keep moving along on your journey!  We have resources available to help you!  Some websites that may be helpful include: www.MyPlate.gov  Www.VeryWellFit.com _____________________________________________________________  Activity Recommendations for All Patients  I recommend that all adults get at least 20 minutes of moderate physical activity that elevates your heart rate at least 5 days out of the week.  Some examples  include: Walking or jogging at a pace that allows you to carry on a conversation Cycling (stationary bike or outdoors) Water aerobics Yoga Weight lifting Dancing If physical limitations prevent you from putting stress on your joints, exercise in a pool or seated in a chair are excellent options.  Do determine your MAXIMUM heart rate for activity: 220 - YOUR AGE = MAX Heart Rate   Remember! Do not push yourself too hard.  Start slowly and build up your pace, speed, weight, time in exercise, etc.  Allow your body to rest between exercise and get good sleep. You will need more water than normal when you are exerting yourself. Do not wait until you are thirsty to drink. Drink with a purpose of getting in at least 8, 8 ounce glasses of water a day plus more depending on how much you exercise and sweat.    If you begin to develop dizziness, chest pain, abdominal pain, jaw pain, shortness of breath, headache, vision changes, lightheadedness, or other concerning symptoms,   stop the activity and allow your body to rest. If your symptoms are severe, seek emergency evaluation immediately. If your symptoms are concerning, but not severe, please let us know so that we can recommend further evaluation.     

## 2022-07-17 NOTE — Assessment & Plan Note (Addendum)
Diagnosed in 2020, treated with radiation and lumpectomy. Currently on Tamoxifen. Following with oncology .Nerve Pain: Experiencing nerve pain in arm post-radiation and surgery. Currently on Gabapentin. -Continue Gabapentin as prescribed.

## 2022-07-17 NOTE — Assessment & Plan Note (Signed)
Stable on current regimen   

## 2022-08-26 ENCOUNTER — Other Ambulatory Visit: Payer: Self-pay

## 2022-08-26 ENCOUNTER — Other Ambulatory Visit (HOSPITAL_BASED_OUTPATIENT_CLINIC_OR_DEPARTMENT_OTHER): Payer: Self-pay

## 2022-08-27 ENCOUNTER — Telehealth: Payer: Self-pay

## 2022-08-27 NOTE — Telephone Encounter (Signed)
Called patient to schedule new patient appointment. Left voicemail with our contact information to call back and schedule.  

## 2022-09-10 ENCOUNTER — Other Ambulatory Visit (HOSPITAL_BASED_OUTPATIENT_CLINIC_OR_DEPARTMENT_OTHER): Payer: Self-pay

## 2022-10-01 ENCOUNTER — Other Ambulatory Visit (HOSPITAL_BASED_OUTPATIENT_CLINIC_OR_DEPARTMENT_OTHER): Payer: Self-pay

## 2022-10-01 ENCOUNTER — Other Ambulatory Visit: Payer: Self-pay

## 2022-10-03 ENCOUNTER — Other Ambulatory Visit (HOSPITAL_BASED_OUTPATIENT_CLINIC_OR_DEPARTMENT_OTHER): Payer: Self-pay

## 2022-10-04 ENCOUNTER — Ambulatory Visit: Payer: Medicaid Other | Admitting: Oncology

## 2022-10-04 ENCOUNTER — Inpatient Hospital Stay: Payer: Medicaid Other

## 2022-10-10 ENCOUNTER — Inpatient Hospital Stay: Payer: Medicaid Other | Attending: Hematology & Oncology

## 2022-10-10 ENCOUNTER — Inpatient Hospital Stay: Payer: Medicaid Other | Admitting: Medical Oncology

## 2022-10-10 ENCOUNTER — Encounter: Payer: Self-pay | Admitting: Medical Oncology

## 2022-10-10 ENCOUNTER — Other Ambulatory Visit: Payer: Self-pay

## 2022-10-10 VITALS — BP 140/82 | HR 64 | Temp 98.6°F | Resp 18 | Ht 62.0 in | Wt 143.0 lb

## 2022-10-10 DIAGNOSIS — Z79810 Long term (current) use of selective estrogen receptor modulators (SERMs): Secondary | ICD-10-CM | POA: Insufficient documentation

## 2022-10-10 DIAGNOSIS — Z170 Estrogen receptor positive status [ER+]: Secondary | ICD-10-CM | POA: Insufficient documentation

## 2022-10-10 DIAGNOSIS — C50911 Malignant neoplasm of unspecified site of right female breast: Secondary | ICD-10-CM | POA: Diagnosis not present

## 2022-10-10 LAB — CBC WITH DIFFERENTIAL (CANCER CENTER ONLY)
Abs Immature Granulocytes: 0.01 10*3/uL (ref 0.00–0.07)
Basophils Absolute: 0 10*3/uL (ref 0.0–0.1)
Basophils Relative: 1 %
Eosinophils Absolute: 0.2 10*3/uL (ref 0.0–0.5)
Eosinophils Relative: 3 %
HCT: 39.6 % (ref 36.0–46.0)
Hemoglobin: 13.3 g/dL (ref 12.0–15.0)
Immature Granulocytes: 0 %
Lymphocytes Relative: 54 %
Lymphs Abs: 2.8 10*3/uL (ref 0.7–4.0)
MCH: 32.5 pg (ref 26.0–34.0)
MCHC: 33.6 g/dL (ref 30.0–36.0)
MCV: 96.8 fL (ref 80.0–100.0)
Monocytes Absolute: 0.4 10*3/uL (ref 0.1–1.0)
Monocytes Relative: 8 %
Neutro Abs: 1.8 10*3/uL (ref 1.7–7.7)
Neutrophils Relative %: 34 %
Platelet Count: 214 10*3/uL (ref 150–400)
RBC: 4.09 MIL/uL (ref 3.87–5.11)
RDW: 13.4 % (ref 11.5–15.5)
WBC Count: 5.3 10*3/uL (ref 4.0–10.5)
nRBC: 0 % (ref 0.0–0.2)

## 2022-10-10 LAB — CMP (CANCER CENTER ONLY)
ALT: 7 U/L (ref 0–44)
AST: 12 U/L — ABNORMAL LOW (ref 15–41)
Albumin: 3.8 g/dL (ref 3.5–5.0)
Alkaline Phosphatase: 51 U/L (ref 38–126)
Anion gap: 5 (ref 5–15)
BUN: 9 mg/dL (ref 6–20)
CO2: 30 mmol/L (ref 22–32)
Calcium: 9.1 mg/dL (ref 8.9–10.3)
Chloride: 107 mmol/L (ref 98–111)
Creatinine: 0.91 mg/dL (ref 0.44–1.00)
GFR, Estimated: >60 mL/min (ref >60)
Glucose, Bld: 94 mg/dL (ref 70–99)
Potassium: 4 mmol/L (ref 3.5–5.1)
Sodium: 142 mmol/L (ref 135–145)
Total Bilirubin: 0.5 mg/dL (ref 0.3–1.2)
Total Protein: 6.8 g/dL (ref 6.5–8.1)

## 2022-10-10 LAB — LACTATE DEHYDROGENASE: LDH: 155 U/L (ref 98–192)

## 2022-10-10 NOTE — Progress Notes (Signed)
Hematology and Oncology Follow Up Visit  Evelyn Cantrell [id number] 02/20/1975 47 y.o. 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 daily-started on 06/2018     Interim History:  Evelyn Cantrell is back for her follow-up.    Today she reports that she has been doing ok. She has some fatigue.   She is working.  She is working full-time.  She has had no problems with nausea or vomiting.  She has had no cough or shortness of breath.  She has had no change in bowel or bladder habits.  Overall, I would say that her performance status is probably ECOG 1.  Wt Readings from Last 3 Encounters:  10/10/22 143 lb (64.9 kg)  07/17/22 144 lb (65.3 kg)  07/08/22 146 lb (66.2 kg)    Medications:  Current Outpatient Medications:    albuterol (VENTOLIN HFA) 108 (90 Base) MCG/ACT inhaler, Inhale 1-2 puffs into the lungs every 6 (six) hours as needed for wheezing or shortness of breath., Disp: 18 g, Rfl: 0   citalopram (CELEXA) 10 MG tablet, Take 1 tablet (10 mg total) by mouth daily., Disp: 90 tablet, Rfl: 1   fluticasone (FLONASE) 50 MCG/ACT nasal spray, Place 2 sprays into both nostrils daily., Disp: 16 g, Rfl: 5   gabapentin (NEURONTIN) 300 MG capsule, Take 1 capsule (300 mg total) by mouth 3 (three) times daily., Disp: 90 capsule, Rfl: 4   hydrOXYzine (ATARAX) 25 MG tablet, Take 1 tablet (25 mg total) by mouth daily as needed., Disp: 30 tablet, Rfl: 5   nicotine (NICODERM CQ) 21 mg/24hr patch, Place 1 patch (21 mg total) onto the skin daily., Disp: 28 patch, Rfl: 0   tamoxifen (NOLVADEX) 20 MG tablet, Take 1 tablet (20 mg total) by mouth daily., Disp: 90 tablet, Rfl: 4   telmisartan (MICARDIS) 20 MG tablet, Take 1 tablet (20 mg total) by mouth daily., Disp: 30 tablet, Rfl: 4  Allergies: No Known Allergies  Past Medical History, Surgical history, Social history, and  Family History were reviewed and updated.  Review of Systems: Review of Systems  Constitutional:  Positive for fatigue.  HENT:  Negative.    Eyes: Negative.   Respiratory: Negative.    Cardiovascular: Negative.   Gastrointestinal: Negative.   Endocrine: Negative.   Genitourinary:  Negative for dysuria.   Musculoskeletal:  Negative for flank pain.  Neurological: Negative.   Hematological: Negative.   Psychiatric/Behavioral: Negative.      Physical Exam:  height is 5\' 2"  (1.575 m) and weight is 143 lb (64.9 kg). Her oral temperature is 98.6 F (37 C). Her blood pressure is 140/82 (abnormal) and her pulse is 64. Her respiration is 18 and oxygen saturation is 100%.   Wt Readings from Last 3 Encounters:  10/10/22 143 lb (64.9 kg)  07/17/22 144 lb (65.3 kg)  07/08/22 146 lb (66.2 kg)    Physical Exam Vitals reviewed.  Constitutional:      Comments: Breast exam shows left breast with no masses, edema or erythema.  There is no left axillary adenopathy.  Right breast shows well-healed lumpectomy at the 5 o'clock position.  She has a well-healed surgical scar.  She has no areola swelling.  There is no distinct mass in the right breast.  There is no right axillary adenopathy.  HENT:     Head: Normocephalic and atraumatic.  Eyes:     Pupils:  Pupils are equal, round, and reactive to light.  Cardiovascular:     Rate and Rhythm: Normal rate and regular rhythm.     Heart sounds: Normal heart sounds.  Pulmonary:     Effort: Pulmonary effort is normal.     Breath sounds: Normal breath sounds.  Abdominal:     General: Bowel sounds are normal.     Palpations: Abdomen is soft.  Musculoskeletal:        General: No tenderness or deformity. Normal range of motion.     Cervical back: Normal range of motion.  Lymphadenopathy:     Cervical: No cervical adenopathy.  Skin:    General: Skin is warm and dry.     Findings: No erythema or rash.  Neurological:     Mental Status: She is alert and  oriented to person, place, and time.  Psychiatric:        Behavior: Behavior normal.        Thought Content: Thought content normal.        Judgment: Judgment normal.      Lab Results  Component Value Date   WBC 5.3 10/10/2022   HGB 13.3 10/10/2022   HCT 39.6 10/10/2022   MCV 96.8 10/10/2022   PLT 214 10/10/2022     Chemistry      Component Value Date/Time   NA 142 10/10/2022 0850   K 4.0 10/10/2022 0850   CL 107 10/10/2022 0850   CO2 30 10/10/2022 0850   BUN 9 10/10/2022 0850   CREATININE 0.91 10/10/2022 0850      Component Value Date/Time   CALCIUM 9.1 10/10/2022 0850   ALKPHOS 51 10/10/2022 0850   AST 12 (L) 10/10/2022 0850   ALT 7 10/10/2022 0850   BILITOT 0.5 10/10/2022 0850       Impression and Plan: Evelyn Cantrell is a very charming 47 year old premenopausal African-American female with a early stage-stage Ia-breast cancer.  Of note when we last saw her, her estradiol level was 76.  She could consider trialing OTC B12 and/or Vitamin D supplements for her fatigue along with stress reduction and sleep improvement  Mammogram-06/28/2022- BI-RADS Category 2 Continue Tamoxifen  RTC 3 months MD no labs  Rushie Chestnut, PA-C 10/3/20249:40 AM

## 2022-11-21 ENCOUNTER — Other Ambulatory Visit (HOSPITAL_BASED_OUTPATIENT_CLINIC_OR_DEPARTMENT_OTHER): Payer: Self-pay

## 2022-11-21 ENCOUNTER — Other Ambulatory Visit: Payer: Self-pay | Admitting: Hematology & Oncology

## 2022-11-21 MED ORDER — GABAPENTIN 300 MG PO CAPS
300.0000 mg | ORAL_CAPSULE | Freq: Three times a day (TID) | ORAL | 4 refills | Status: DC
  Filled 2022-11-21: qty 90, 30d supply, fill #0
  Filled 2022-12-30: qty 90, 30d supply, fill #1
  Filled 2023-02-12: qty 90, 30d supply, fill #2
  Filled 2023-03-07 – 2023-05-09 (×3): qty 90, 30d supply, fill #3
  Filled 2023-07-23 – 2023-08-07 (×2): qty 90, 30d supply, fill #4

## 2022-11-28 ENCOUNTER — Ambulatory Visit: Payer: Medicaid Other | Admitting: Family Medicine

## 2022-11-28 ENCOUNTER — Encounter: Payer: Self-pay | Admitting: Family Medicine

## 2022-11-28 ENCOUNTER — Other Ambulatory Visit (HOSPITAL_COMMUNITY)
Admission: RE | Admit: 2022-11-28 | Discharge: 2022-11-28 | Disposition: A | Discharge status: Home / Self Care | Payer: Medicaid Other | Source: Ambulatory Visit | Attending: Family Medicine | Admitting: Family Medicine

## 2022-11-28 VITALS — BP 133/88 | HR 77 | Ht 62.0 in | Wt 147.0 lb

## 2022-11-28 DIAGNOSIS — Z1151 Encounter for screening for human papillomavirus (HPV): Secondary | ICD-10-CM

## 2022-11-28 DIAGNOSIS — Z01419 Encounter for gynecological examination (general) (routine) without abnormal findings: Secondary | ICD-10-CM

## 2022-11-28 DIAGNOSIS — R350 Frequency of micturition: Secondary | ICD-10-CM

## 2022-11-28 DIAGNOSIS — D0511 Intraductal carcinoma in situ of right breast: Secondary | ICD-10-CM

## 2022-11-28 DIAGNOSIS — Z1211 Encounter for screening for malignant neoplasm of colon: Secondary | ICD-10-CM

## 2022-11-28 LAB — POCT URINALYSIS DIPSTICK
Bilirubin, UA: NEGATIVE
Glucose, UA: NEGATIVE
Ketones, UA: NEGATIVE
Leukocytes, UA: NEGATIVE
Nitrite, UA: NEGATIVE
Protein, UA: NEGATIVE
Spec Grav, UA: 1.01 (ref 1.010–1.025)
Urobilinogen, UA: 0.2 U/dL
pH, UA: 6.5 (ref 5.0–8.0)

## 2022-11-28 IMAGING — CR DG CHEST 2V
2 series · 2 of 2 positions shown · non-contrast
Comparison: 11/21/2020

CLINICAL DATA: Headache, cough, head congestion, chest congestion
for 1 week, recent fever, smoker, asthma

EXAM:
CHEST - 2 VIEW

[w chest pa]
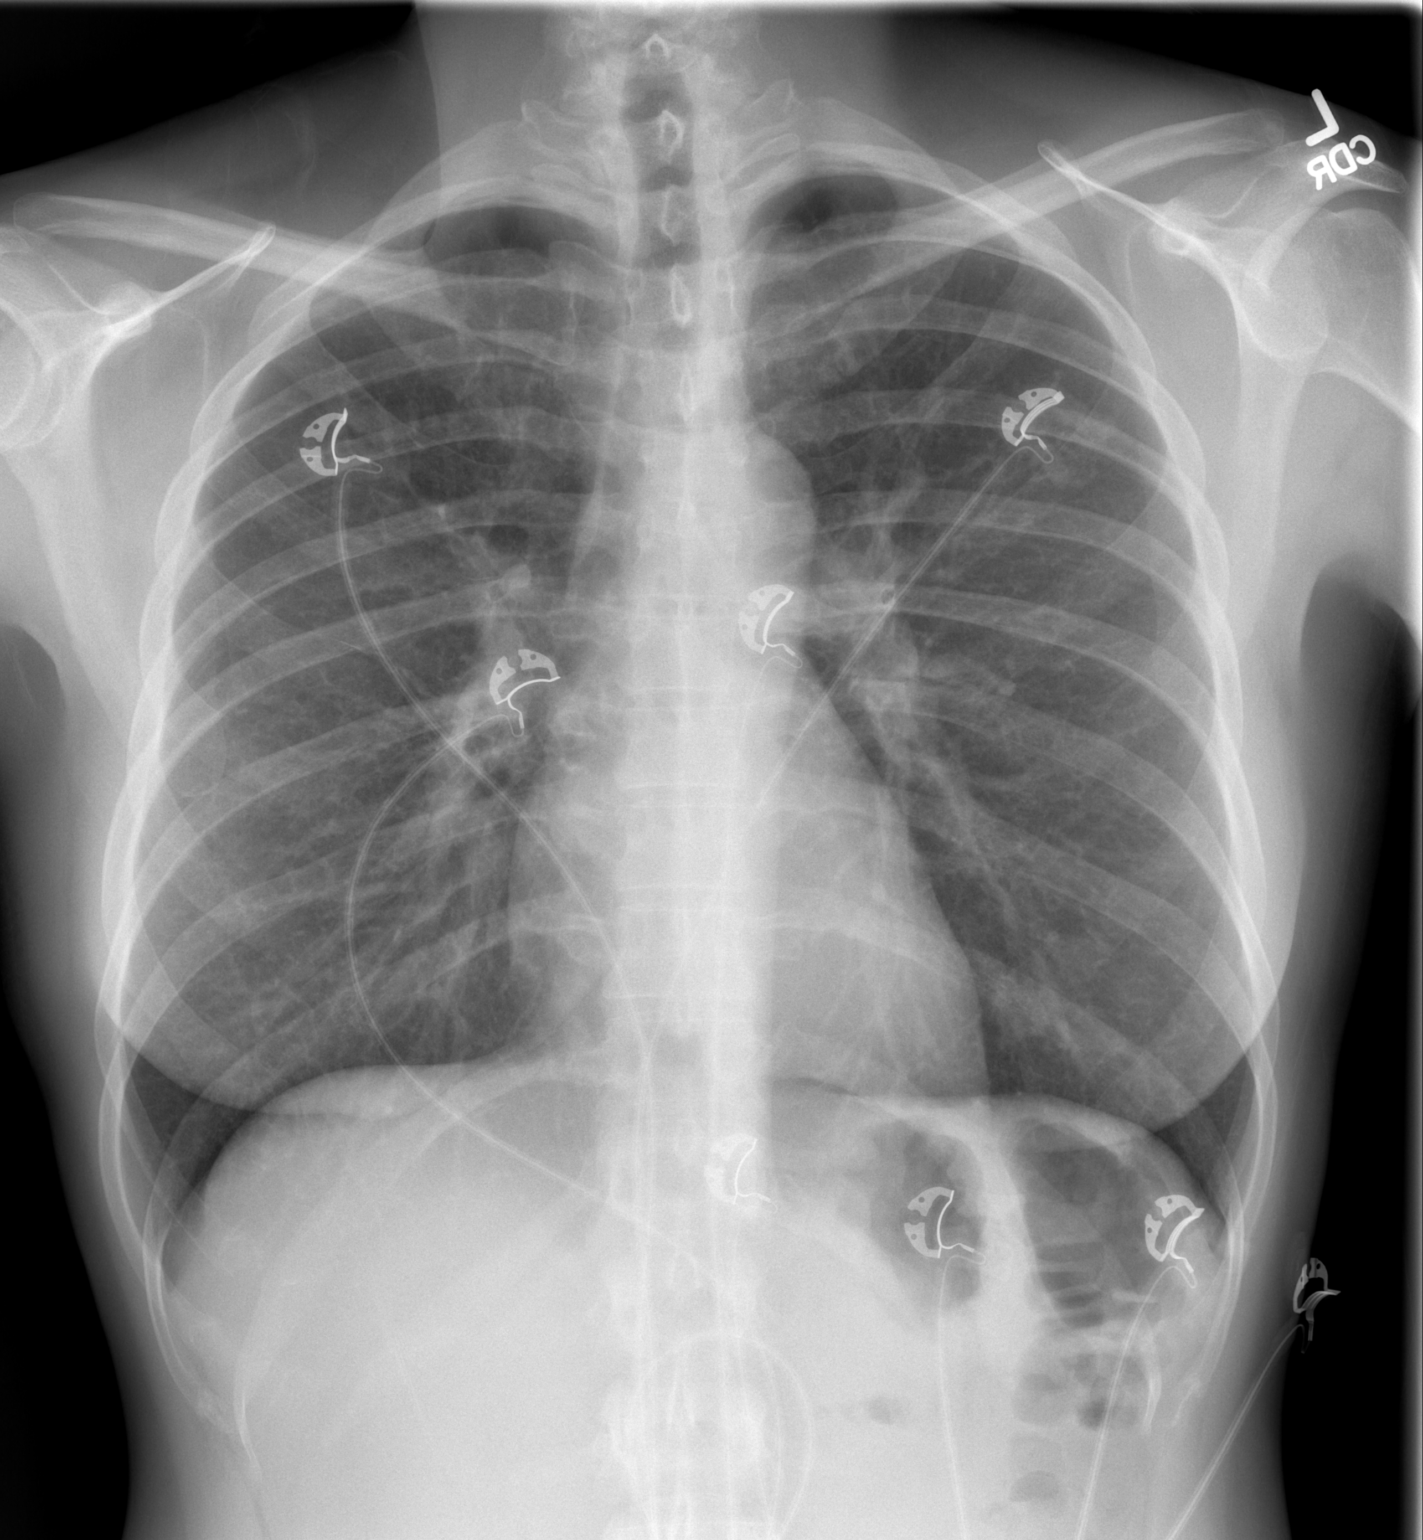

[w chest lat]
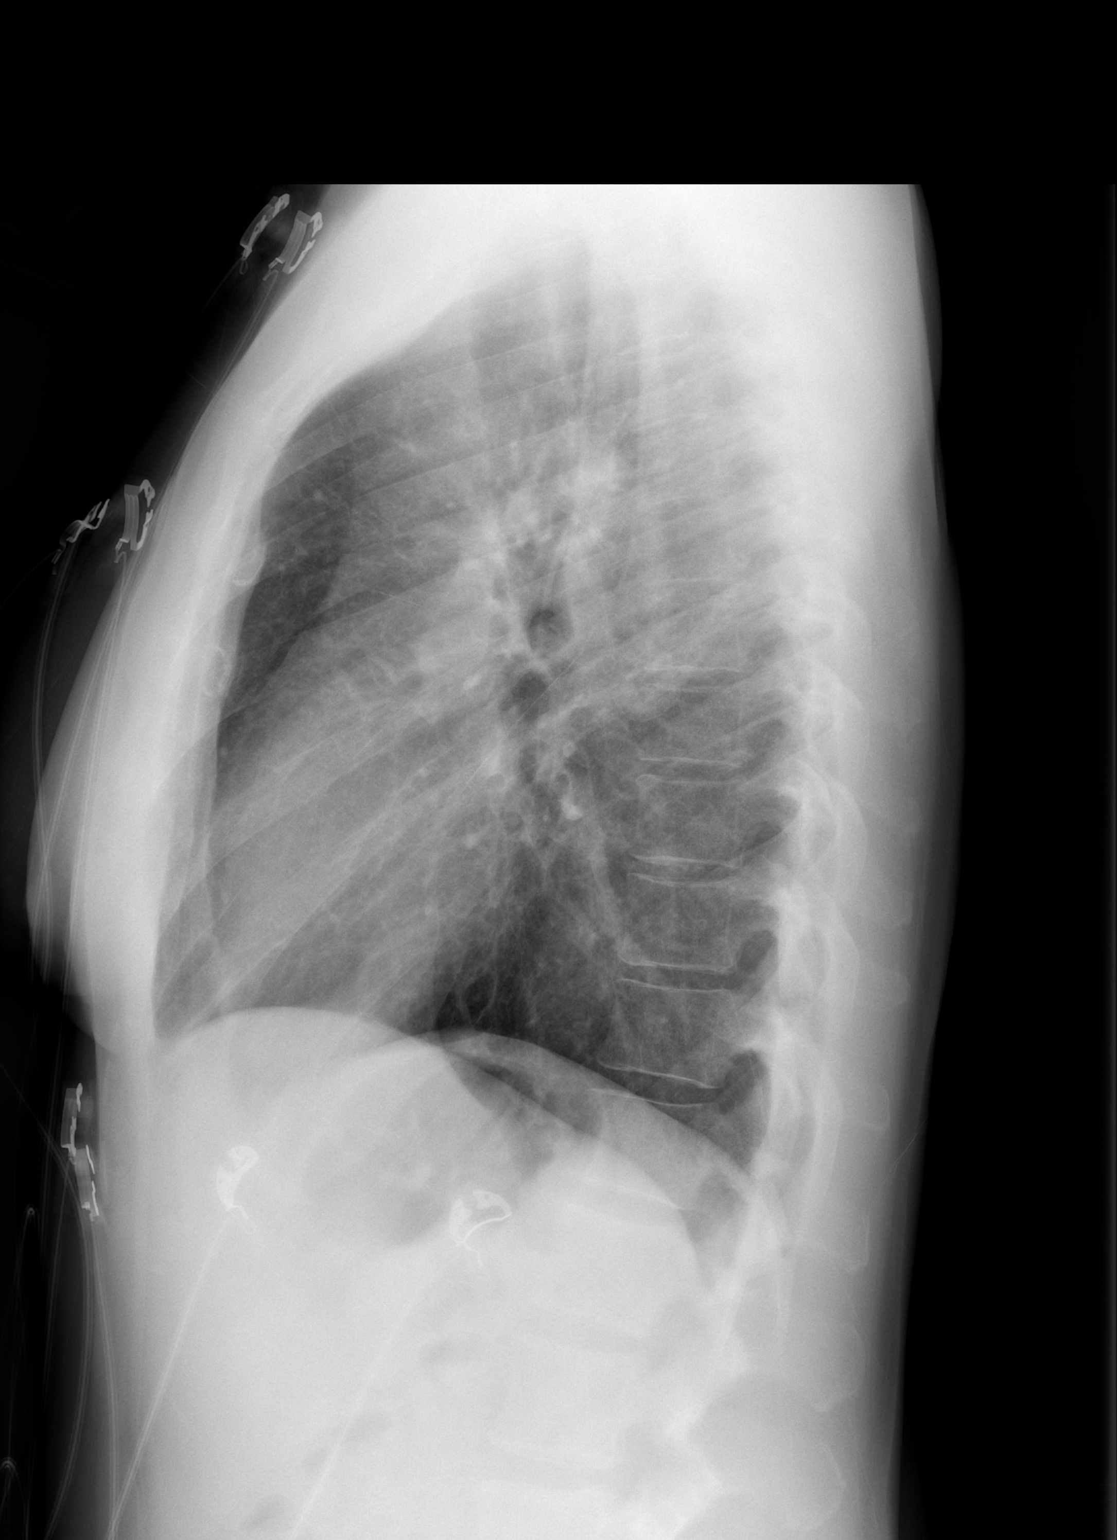

[2 of 2 positions shown; findings below may reference images not displayed]

FINDINGS: Normal heart size, mediastinal contours, and pulmonary vascularity.

Minimal peribronchial thickening, chronic, could be related to
asthma, bronchitis, or smoking.

Lungs clear.

No pleural effusion or pneumothorax.

Bones unremarkable.
IMPRESSION: Mild peribronchial thickening question bronchitis, asthma or related
to smoking.

No acute infiltrate.

## 2022-11-28 NOTE — Progress Notes (Signed)
ANNUAL EXAM Patient name: Evelyn Cantrell MRN [id number]  Date of birth: Jan 14, 1975 Chief Complaint:   Annual Exam  History of Present Illness:   Evelyn Cantrell is a 47 y.o.  [id number]  female  being seen today for a routine annual exam.  Current complaints: frequent urination. No dysuria  No LMP recorded. (Menstrual status: Irregular Periods).    Last pap 2022. Results were: NILM w/ HRHPV negative. H/O abnormal pap: no Last mammogram: 06/2022. Results were: normal.  Last colonoscopy: n/a.     11/28/2022   11:19 AM 07/17/2022    2:06 PM  Depression screen PHQ 2/9  Decreased Interest 2 2  Down, Depressed, Hopeless 1 2  PHQ - 2 Score 3 4  Altered sleeping 2 2  Tired, decreased energy 3 2  Change in appetite 1 2  Feeling bad or failure about yourself  2 1  Trouble concentrating 2 2  Moving slowly or fidgety/restless 0 0  Suicidal thoughts 0 0  PHQ-9 Score 13 13  Difficult doing work/chores  Somewhat difficult        11/28/2022   11:20 AM 07/17/2022    2:06 PM  GAD 7 : Generalized Anxiety Score  Nervous, Anxious, on Edge 1 2  Control/stop worrying 2 2  Worry too much - different things 3 2  Trouble relaxing 2 2  Restless 1 0  Easily annoyed or irritable 3 3  Afraid - awful might happen 3 3  Total GAD 7 Score 15 14  Anxiety Difficulty  Very difficult     Review of Systems:   Pertinent items are noted in HPI Denies any headaches, blurred vision, fatigue, shortness of breath, chest pain, abdominal pain, abnormal vaginal discharge/itching/odor/irritation, problems with periods, bowel movements, urination, or intercourse unless otherwise stated above. Pertinent History Reviewed:  Reviewed past medical,surgical, social and family history.  Reviewed problem list, medications and allergies. Physical Assessment:   Vitals:   11/28/22 1110 11/28/22 1117  BP: (!) 135/96 133/88  Pulse: 78 77  Weight: 147 lb (66.7 kg)   Height: 5\' 2"  (1.575 m)   Body mass index is 26.89  kg/m.        Physical Examination:   General appearance - well appearing, and in no distress  Mental status - alert, oriented to person, place, and time  Psych:  She has a normal mood and affect  Skin - warm and dry, normal color, no suspicious lesions noted  Chest - effort normal, all lung fields clear to auscultation bilaterally  Heart - normal rate and regular rhythm  Neck:  midline trachea, no thyromegaly or nodules  Breasts - breasts appear normal, no suspicious masses, no skin or nipple changes or axillary nodes  Abdomen - soft, nontender, nondistended, no masses or organomegaly  Pelvic - VULVA: normal appearing vulva with no masses, tenderness or lesions  VAGINA: normal appearing vagina with normal color and discharge, no lesions  CERVIX: normal appearing cervix without discharge or lesions, no CMT  Thin prep pap is done with HR HPV cotesting  UTERUS: uterus is felt to be normal size, shape, consistency and nontender   ADNEXA: No adnexal masses or tenderness noted.  Extremities:  No swelling or varicosities noted  Chaperone present for exam  Assessment & Plan:  1. Well woman exam with routine gynecological exam - Cytology - PAP( [place])  2. 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 Normal Mammogram  3. Colon cancer screening - Ambulatory referral to Gastroenterology  4. Frequent urination - POCT urinalysis dipstick - Urine Culture   Labs/procedures today:   Orders Placed This Encounter  Procedures   Urine Culture   Ambulatory referral to Gastroenterology   POCT urinalysis dipstick    Meds: No orders of the defined types were placed in this encounter.   Follow-up: No follow-ups on file.  Levie Heritage, DO 11/28/2022 5:15 PM

## 2022-12-01 LAB — URINE CULTURE

## 2022-12-02 LAB — CYTOLOGY - PAP
Adequacy: ABSENT
Comment: NEGATIVE
Comment: NEGATIVE
Comment: NEGATIVE
Diagnosis: NEGATIVE
HPV 16: NEGATIVE
HPV 18 / 45: NEGATIVE
High risk HPV: POSITIVE — AB

## 2022-12-03 ENCOUNTER — Telehealth: Payer: Self-pay

## 2022-12-03 NOTE — Telephone Encounter (Signed)
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 Left message for patient to call the office back. Karel Turpen l Deshonna Trnka, CMA

## 2022-12-03 NOTE — Telephone Encounter (Signed)
-----   Message from Evelyn Cantrell sent at 12/03/2022 12:16 PM EST ----- Call to see if patient would like Gardisil (HPV) vaccine.

## 2022-12-12 ENCOUNTER — Ambulatory Visit (INDEPENDENT_AMBULATORY_CARE_PROVIDER_SITE_OTHER): Payer: BC Managed Care – PPO

## 2022-12-12 VITALS — BP 129/91 | HR 86 | Wt 150.0 lb

## 2022-12-12 DIAGNOSIS — Z23 Encounter for immunization: Secondary | ICD-10-CM

## 2022-12-12 NOTE — Progress Notes (Signed)
Evelyn Cantrell here for  HPV   Injection.  Injection administered without complication. Patient will return in  2 months  for next injection.  [name] l Matheu Ploeger, CMA 12/12/2022  10:54 AM

## 2022-12-24 ENCOUNTER — Telehealth: Payer: Self-pay | Admitting: Family Medicine

## 2022-12-24 NOTE — Telephone Encounter (Signed)
error 

## 2022-12-31 ENCOUNTER — Other Ambulatory Visit: Payer: Self-pay

## 2023-01-10 ENCOUNTER — Other Ambulatory Visit: Payer: Self-pay

## 2023-01-10 ENCOUNTER — Inpatient Hospital Stay: Payer: 59 | Attending: Hematology & Oncology | Admitting: Family

## 2023-01-10 ENCOUNTER — Encounter: Payer: Self-pay | Admitting: Family

## 2023-01-10 ENCOUNTER — Inpatient Hospital Stay: Payer: 59

## 2023-01-10 VITALS — BP 130/83 | HR 79 | Temp 99.1°F | Resp 16 | Wt 150.0 lb

## 2023-01-10 DIAGNOSIS — C50911 Malignant neoplasm of unspecified site of right female breast: Secondary | ICD-10-CM

## 2023-01-10 DIAGNOSIS — C50011 Malignant neoplasm of nipple and areola, right female breast: Secondary | ICD-10-CM

## 2023-01-10 DIAGNOSIS — Z79810 Long term (current) use of selective estrogen receptor modulators (SERMs): Secondary | ICD-10-CM | POA: Diagnosis not present

## 2023-01-10 DIAGNOSIS — Z1721 Progesterone receptor positive status: Secondary | ICD-10-CM | POA: Diagnosis not present

## 2023-01-10 DIAGNOSIS — Z170 Estrogen receptor positive status [ER+]: Secondary | ICD-10-CM | POA: Diagnosis not present

## 2023-01-10 DIAGNOSIS — Z1732 Human epidermal growth factor receptor 2 negative status: Secondary | ICD-10-CM | POA: Insufficient documentation

## 2023-01-10 LAB — CBC WITH DIFFERENTIAL (CANCER CENTER ONLY)
Abs Immature Granulocytes: 0.02 10*3/uL (ref 0.00–0.07)
Basophils Absolute: 0 10*3/uL (ref 0.0–0.1)
Basophils Relative: 1 %
Eosinophils Absolute: 0.1 10*3/uL (ref 0.0–0.5)
Eosinophils Relative: 2 %
HCT: 41.6 % (ref 36.0–46.0)
Hemoglobin: 14.2 g/dL (ref 12.0–15.0)
Immature Granulocytes: 0 %
Lymphocytes Relative: 41 %
Lymphs Abs: 2.8 10*3/uL (ref 0.7–4.0)
MCH: 32.3 pg (ref 26.0–34.0)
MCHC: 34.1 g/dL (ref 30.0–36.0)
MCV: 94.8 fL (ref 80.0–100.0)
Monocytes Absolute: 0.6 10*3/uL (ref 0.1–1.0)
Monocytes Relative: 9 %
Neutro Abs: 3.2 10*3/uL (ref 1.7–7.7)
Neutrophils Relative %: 47 %
Platelet Count: 183 10*3/uL (ref 150–400)
RBC: 4.39 MIL/uL (ref 3.87–5.11)
RDW: 12.8 % (ref 11.5–15.5)
WBC Count: 6.8 10*3/uL (ref 4.0–10.5)
nRBC: 0 % (ref 0.0–0.2)

## 2023-01-10 LAB — CMP (CANCER CENTER ONLY)
ALT: 8 U/L (ref 0–44)
AST: 16 U/L (ref 15–41)
Albumin: 4.3 g/dL (ref 3.5–5.0)
Alkaline Phosphatase: 55 U/L (ref 38–126)
Anion gap: 9 (ref 5–15)
BUN: 9 mg/dL (ref 6–20)
CO2: 27 mmol/L (ref 22–32)
Calcium: 9.8 mg/dL (ref 8.9–10.3)
Chloride: 104 mmol/L (ref 98–111)
Creatinine: 1.07 mg/dL — ABNORMAL HIGH (ref 0.44–1.00)
GFR, Estimated: >60 mL/min (ref >60)
Glucose, Bld: 90 mg/dL (ref 70–99)
Potassium: 4.2 mmol/L (ref 3.5–5.1)
Sodium: 140 mmol/L (ref 135–145)
Total Bilirubin: 0.5 mg/dL (ref 0.0–1.2)
Total Protein: 7.6 g/dL (ref 6.5–8.1)

## 2023-01-10 NOTE — Progress Notes (Signed)
 Hematology and Oncology Follow Up Visit  Evelyn Cantrell [id number] 05-Feb-1975 47 y.o. 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 She is doing well but does note fatigue.  She had a virus last week and is finally starting to feel better.  Appetite is slowly improving. Hydration is good. Weight is stable at 150 lbs.  She notes some intermittent tenderness along her lumpectomy incision lin at the 5-6 oclock position. No mass, lesion or rash noted on bilateral breast exam today.  Patient does occasional self breast exams at home.  Mammogram is due again in 06/2023.  No adenopathy or lymphedema on exam.  She is taking her Tamoxifen  daily as prescribed.  She has stopped taking Celexa  due to feeling lightheaded and will address with PCP.  No hot flashes or night sweats.  No fever, chills, n/v, cough, rash, dizziness, SOB, chest pain, palpitations, abdominal pain or changes in bowel or bladder habits at this time.  No swelling or tenderness in her extremities.  Tingling in her hands comes and goes with history of carpal tunnel.  No falls or syncope reported.  No blood loss, bruising or petechiae noted.    ECOG Performance Status: 1 - Symptomatic but completely ambulatory  Medications:  Allergies as of 01/10/2023   No Known Allergies      Medication List        Accurate as of January 10, 2023 10:10 AM. If you have any questions, ask your nurse or doctor.          citalopram  10 MG tablet Commonly known as: CELEXA  Take 1 tablet (10 mg total) by mouth daily.   fluticasone  50 MCG/ACT nasal spray Commonly known as: FLONASE  Place 2 sprays into both nostrils daily.   gabapentin  300 MG capsule Commonly known as: NEURONTIN  Take 1 capsule (300 mg total) by mouth  3 (three) times daily.   hydrOXYzine  25 MG tablet Commonly known as: ATARAX  Take 1 tablet (25 mg total) by mouth daily as needed.   nicotine  21 mg/24hr patch Commonly known as: Nicoderm CQ  Place 1 patch (21 mg total) onto the skin daily.   tamoxifen  20 MG tablet Commonly known as: NOLVADEX  Take 1 tablet (20 mg total) by mouth daily.   telmisartan  20 MG tablet Commonly known as: Micardis  Take 1 tablet (20 mg total) by mouth daily.   Ventolin  HFA 108 (90 Base) MCG/ACT inhaler Generic drug: albuterol  Inhale 1-2 puffs into the lungs every 6 (six) hours as needed for wheezing or shortness of breath.        Allergies: No Known Allergies  Past Medical History, Surgical history, Social history, and Family History were reviewed and updated.  Review of Systems: All other 10 point review of systems is negative.   Physical Exam:  weight is 150 lb (68 kg). Her oral temperature is 99.1 F (37.3 C). Her blood pressure is 130/83 and her pulse is 79. Her respiration is 16 and oxygen saturation is 100%.   Wt Readings from Last 3 Encounters:  01/10/23 150 lb (68 kg)  12/12/22 150 lb (68 kg)  11/28/22 147 lb (66.7 kg)    Ocular: Sclerae unicteric, pupils equal, round and reactive to light Ear-nose-throat: Oropharynx clear, dentition fair Lymphatic: No cervical, supraclavicular or axillary  adenopathy Lungs no rales or rhonchi, good excursion bilaterally Heart regular rate and rhythm, no murmur appreciated Abd soft, nontender, positive bowel sounds MSK no focal spinal tenderness, no joint edema Neuro: non-focal, well-oriented, appropriate affect Breasts: Same as above.   Lab Results  Component Value Date   WBC 5.3 10/10/2022   HGB 13.3 10/10/2022   HCT 39.6 10/10/2022   MCV 96.8 10/10/2022   PLT 214 10/10/2022   No results found for: FERRITIN, IRON, TIBC, UIBC, IRONPCTSAT Lab Results  Component Value Date   RBC 4.09 10/10/2022   No results found for:  KPAFRELGTCHN, LAMBDASER, KAPLAMBRATIO No results found for: IGGSERUM, IGA, IGMSERUM No results found for: STEPHANY CARLOTA BENSON MARKEL EARLA JOANNIE DOC VICK, SPEI   Chemistry      Component Value Date/Time   NA 142 10/10/2022 0850   K 4.0 10/10/2022 0850   CL 107 10/10/2022 0850   CO2 30 10/10/2022 0850   BUN 9 10/10/2022 0850   CREATININE 0.91 10/10/2022 0850      Component Value Date/Time   CALCIUM 9.1 10/10/2022 0850   ALKPHOS 51 10/10/2022 0850   AST 12 (L) 10/10/2022 0850   ALT 7 10/10/2022 0850   BILITOT 0.5 10/10/2022 0850       Impression and Plan: Evelyn Cantrell is a very charming 48 yo premenopausal African American female with history of early stage - stage Ia - breast cancer, ER/PR + and HER2 -.   Of note, estradiol  level in 07/2022 was 113. Hormone studies are pending.  Continue same regimen with Tamoxifen  to complete 5 years in  06/2023.  Follow-up in 4 months.   Evelyn Pepper, NP 1/3/202510:10 AM

## 2023-01-11 ENCOUNTER — Encounter: Payer: Self-pay | Admitting: Family

## 2023-01-11 LAB — FOLLICLE STIMULATING HORMONE: FSH: 42.9 m[IU]/mL

## 2023-01-11 LAB — LUTEINIZING HORMONE: LH: 37.8 m[IU]/mL

## 2023-01-13 LAB — ESTRADIOL, ULTRA SENS: Estradiol, Sensitive: 86.6 pg/mL

## 2023-01-23 ENCOUNTER — Other Ambulatory Visit: Payer: Self-pay | Admitting: Hematology & Oncology

## 2023-01-24 ENCOUNTER — Other Ambulatory Visit (HOSPITAL_BASED_OUTPATIENT_CLINIC_OR_DEPARTMENT_OTHER): Payer: Self-pay

## 2023-01-24 MED ORDER — NICOTINE 21 MG/24HR TD PT24
21.0000 mg | MEDICATED_PATCH | Freq: Every day | TRANSDERMAL | 0 refills | Status: AC
  Filled 2023-01-24 – 2023-02-12 (×2): qty 28, 28d supply, fill #0

## 2023-02-05 ENCOUNTER — Other Ambulatory Visit (HOSPITAL_BASED_OUTPATIENT_CLINIC_OR_DEPARTMENT_OTHER): Payer: Self-pay

## 2023-02-12 ENCOUNTER — Ambulatory Visit (INDEPENDENT_AMBULATORY_CARE_PROVIDER_SITE_OTHER): Payer: 59

## 2023-02-12 ENCOUNTER — Other Ambulatory Visit (HOSPITAL_BASED_OUTPATIENT_CLINIC_OR_DEPARTMENT_OTHER): Payer: Self-pay

## 2023-02-12 VITALS — BP 125/86 | HR 81 | Ht 62.0 in | Wt 152.0 lb

## 2023-02-12 DIAGNOSIS — Z23 Encounter for immunization: Secondary | ICD-10-CM

## 2023-02-12 NOTE — Progress Notes (Signed)
 Evelyn Cantrell is here for their 2nd Gardasil injection. Pt denies any issues at this time. Pt tolerated injection well. To follow up in 2 months for next injection.  Arrie Lares

## 2023-02-19 ENCOUNTER — Encounter: Payer: Self-pay | Admitting: Internal Medicine

## 2023-03-07 ENCOUNTER — Other Ambulatory Visit: Payer: Self-pay | Admitting: Family Medicine

## 2023-03-07 ENCOUNTER — Other Ambulatory Visit: Payer: Self-pay

## 2023-03-07 ENCOUNTER — Other Ambulatory Visit (HOSPITAL_BASED_OUTPATIENT_CLINIC_OR_DEPARTMENT_OTHER): Payer: Self-pay

## 2023-03-07 DIAGNOSIS — I10 Essential (primary) hypertension: Secondary | ICD-10-CM

## 2023-03-07 MED ORDER — TELMISARTAN 20 MG PO TABS
20.0000 mg | ORAL_TABLET | Freq: Every day | ORAL | 0 refills | Status: DC
  Filled 2023-03-07 – 2023-05-09 (×4): qty 30, 30d supply, fill #0

## 2023-03-10 ENCOUNTER — Other Ambulatory Visit (HOSPITAL_BASED_OUTPATIENT_CLINIC_OR_DEPARTMENT_OTHER): Payer: Self-pay

## 2023-03-20 ENCOUNTER — Other Ambulatory Visit (HOSPITAL_BASED_OUTPATIENT_CLINIC_OR_DEPARTMENT_OTHER): Payer: Self-pay

## 2023-03-21 ENCOUNTER — Other Ambulatory Visit (HOSPITAL_BASED_OUTPATIENT_CLINIC_OR_DEPARTMENT_OTHER): Payer: Self-pay

## 2023-03-21 ENCOUNTER — Other Ambulatory Visit: Payer: Self-pay

## 2023-03-29 ENCOUNTER — Emergency Department (HOSPITAL_BASED_OUTPATIENT_CLINIC_OR_DEPARTMENT_OTHER)

## 2023-03-29 ENCOUNTER — Other Ambulatory Visit: Payer: Self-pay

## 2023-03-29 ENCOUNTER — Encounter (HOSPITAL_BASED_OUTPATIENT_CLINIC_OR_DEPARTMENT_OTHER): Payer: Self-pay | Admitting: Urology

## 2023-03-29 ENCOUNTER — Emergency Department (HOSPITAL_BASED_OUTPATIENT_CLINIC_OR_DEPARTMENT_OTHER)
Admission: EM | Admit: 2023-03-29 | Discharge: 2023-03-29 | Disposition: A | Discharge status: Home / Self Care | Attending: Emergency Medicine | Admitting: Emergency Medicine

## 2023-03-29 DIAGNOSIS — M25562 Pain in left knee: Secondary | ICD-10-CM | POA: Diagnosis present

## 2023-03-29 DIAGNOSIS — W109XXA Fall (on) (from) unspecified stairs and steps, initial encounter: Secondary | ICD-10-CM | POA: Diagnosis not present

## 2023-03-29 DIAGNOSIS — M25512 Pain in left shoulder: Secondary | ICD-10-CM | POA: Diagnosis not present

## 2023-03-29 DIAGNOSIS — Z853 Personal history of malignant neoplasm of breast: Secondary | ICD-10-CM | POA: Insufficient documentation

## 2023-03-29 NOTE — ED Provider Notes (Signed)
 [facility] Provider Note   CSN: [id number] Arrival date & time: 03/29/23  1601     History  Chief Complaint  Patient presents with   Evelyn Cantrell is a 48 y.o. female, history of breast cancer who presents to the ED secondary to a fall that occurred, about a week ago.  She states last Monday, she was going up the stairs, when her son's dog that weighs 200 pounds came up behind her, and pushed her down.  She states she fell on her left shoulder, and her left knee.  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  She has been able to bear weight, and has taken Tylenol with some relief.  Also states that she has some left shoulder pain, that is specifically all the time, and is unable to move her arm, all the way up to her sides, or behind her back.  She states it is extremely painful, to move around, and it radiates up near her neck.  She denies any midline neck pain however.  Denies any bruises, or wounds.  Did not hit her head, is not on any blood thinners.  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    Home Medications Prior to Admission medications   Medication Sig Start Date End Date Taking? Authorizing Provider  albuterol (VENTOLIN HFA) 108 (90 Base) MCG/ACT inhaler Inhale 1-2 puffs into the lungs every 6 (six) hours as needed for wheezing or shortness of breath. 07/17/22   Clayborne Dana, NP  citalopram (CELEXA) 10 MG tablet Take 1 tablet (10 mg total) by mouth daily. Patient not taking: Reported on 11/28/2022 07/17/22   Clayborne Dana, NP  fluticasone [facility]) 50 MCG/ACT nasal spray Place 2 sprays into both nostrils daily. 07/17/22   Clayborne Dana, NP  gabapentin (NEURONTIN) 300 MG capsule Take 1 capsule (300 mg total) by mouth 3 (three) times daily. 11/21/22   Josph Macho, MD  hydrOXYzine  (ATARAX) 25 MG tablet Take 1 tablet (25 mg total) by mouth daily as needed. 07/17/22   Clayborne Dana, NP  nicotine (NICODERM CQ) 21 mg/24hr patch Place 1 patch (21 mg total) onto the skin daily. 01/24/23   Josph Macho, MD  tamoxifen (NOLVADEX) 20 MG tablet Take 1 tablet (20 mg total) by mouth daily. 05/06/22   Josph Macho, MD  telmisartan (MICARDIS) 20 MG tablet Take 1 tablet (20 mg total) by mouth daily. *NEED APPT FOR FURTHER REFILLS.* 03/07/23   Clayborne Dana, NP      Allergies    Patient has no known allergies.    Review of Systems   Review of Systems  Musculoskeletal:        +L Shoulder pain    Physical Exam Updated Vital Signs BP (!) 134/94 (BP Location: Left Arm)   Pulse 79   Temp 98.1 F (36.7 C)   Resp 20   Ht 5\' 2"  (1.575 m)   Wt 68.9 kg   LMP  (Approximate)   SpO2 100%   BMI 27.78 kg/m  Physical Exam Vitals and nursing note reviewed.  Constitutional:      General: She is not in acute distress.    Appearance: She is well-developed.  HENT:     Head: Normocephalic and atraumatic.  Eyes:     Conjunctiva/sclera: Conjunctivae normal.  Cardiovascular:     Rate and Rhythm: Normal rate and regular rhythm.     Heart sounds: No murmur heard. Pulmonary:     Effort: Pulmonary effort is normal. No respiratory distress.     Breath sounds: Normal breath sounds.  Abdominal:     Palpations: Abdomen is soft.     Tenderness: There is no abdominal tenderness.  Musculoskeletal:        General: No swelling.     Cervical back: Neck supple.     Comments: Left shoulder: Patient is tenderness to palpation of the left trapezius, and the left humeral head, as well as the left upper arm.  She has extremely limited range of motion, and cannot internally or externally or abduct her arm completely.  She is guarding her arm.  She has a positive radial pulse, and good capillary refill.  There is no ecchymosis, or wounds present.  Left knee:Tenderness to palpation of medial joint  line. An effusion is not present.  Negative anterior and posterior drawer. Negative Mcmurray's. +Patellar stability. Negative valgus and varus stress test.. Extension and flexion intact. No sensory deficits.    Skin:    General: Skin is warm and dry.     Capillary Refill: Capillary refill takes less than 2 seconds.  Neurological:     Mental Status: She is alert.  Psychiatric:        Mood and Affect: Mood normal.     ED Results / Procedures / Treatments   Labs (all labs ordered are listed, but only abnormal results are displayed) Labs Reviewed - No data to display  EKG None  Radiology DG Knee Complete 4 Views Left Result Date: 03/29/2023 CLINICAL DATA:  Left knee pain following a fall 5 days ago. EXAM: LEFT KNEE - COMPLETE 4+ VIEW COMPARISON:  None Available. FINDINGS: No evidence of fracture, dislocation, or joint effusion. No evidence of arthropathy or other focal bone abnormality. Soft tissues are unremarkable. IMPRESSION: Negative. Electronically Signed   By: Beckie Salts M.D.   On: 03/29/2023 16:57   DG Shoulder Left Result Date: 03/29/2023 CLINICAL DATA:  Status post fall with shoulder pain EXAM: LEFT SHOULDER - 4 VIEW COMPARISON:  None Available. FINDINGS: There is no evidence of fracture or dislocation. There is no evidence of arthropathy or other focal bone abnormality. Soft tissues are unremarkable. IMPRESSION: No acute fracture or dislocation. Electronically Signed   By: Agustin Cree M.D.   On: 03/29/2023 16:54    Procedures Procedures    Medications Ordered in ED Medications - No data to display  ED Course/ Medical Decision Making/ A&P                                 Medical Decision Making Patient is a 48 year old female, history of breast cancer, here for left shoulder pain, left knee pain, after falling about a week ago.  She has extremely limited range of motion, of her left shoulder.  She has tenderness to palpation of the trapezius, as well as the left humeral  head, as well as the left upper arm.  Will obtain x-ray of her shoulder, for further evaluation.  Also will obtain x-ray, of the left knee.  Range of motion of the knee is within normal limits, shoulder is acutely tender, and difficult to range, given the pain, and weakness.  I am suspicious for likely rotator cuff  tear.  Amount and/or Complexity of Data Reviewed Radiology: ordered.    Details: X-rays unremarkable Discussion of management or test interpretation with external provider(s): Discussed with patient, x-rays are unremarkable, given concern for rotator cuff tear, I will have her follow-up with orthopedics, as well as put her in a sling.  She has a good pulse, and she has no midline tenderness to palpation of her neck, which is overall reassuring.  We discussed elevation, placed in sling and immobilizer, and follow-up with orthopedics.  Discussed return precautions.  Believe that her knee likely represents a contusion versus sprain, given the trauma.  We discussed elevation, Tylenol, as well as ice for the pain  Impression(s) / ED Diagnoses Final diagnoses:  Acute pain of left knee  Acute pain of left shoulder    Rx / DC Orders ED Discharge Orders     None         Chella Chapdelaine, Harley Alto, PA 03/29/23 1753    Alvira Monday, MD 03/31/23 2255

## 2023-03-29 NOTE — ED Triage Notes (Signed)
 Left shoulder pain and left knee pain after fall last Monday while walking the dog  Decreased ROM due to pain in shoulder  Ambulatory to triage

## 2023-03-29 NOTE — ED Notes (Signed)
 Unable to get discharge BP due to sling on left arm and restricted right arm. Reviewed discharge instructions, follow up with pt. Pt states understanding. Ambulatory at discharge

## 2023-03-29 NOTE — Discharge Instructions (Addendum)
 Concerned that you may have possible rotator cuff tear, given your limited range of motion.  Your x-ray was normal.  I want you to wear the sling at all times, and make sure your arm is elevated.  Please follow-up with Dr. Sherrie Sport, for further evaluation.  Call and make an appointment with his office.  I do not want you driving, while you are in the sling.  Is very important, as it is dangerous.  Additionally your knee pain, is likely secondary to the trauma.  You may have sprained something in your knee.  Make sure you are resting it, elevating it, and taking Tylenol for pain control.  Return to the ER if you have worsening pain, loss of sensation in your arm or your leg, or any changes in color in your arm or leg

## 2023-03-31 ENCOUNTER — Other Ambulatory Visit (HOSPITAL_BASED_OUTPATIENT_CLINIC_OR_DEPARTMENT_OTHER): Payer: Self-pay

## 2023-04-08 ENCOUNTER — Other Ambulatory Visit (HOSPITAL_BASED_OUTPATIENT_CLINIC_OR_DEPARTMENT_OTHER): Payer: Self-pay

## 2023-04-08 MED ORDER — METHYLPREDNISOLONE 4 MG PO TBPK
ORAL_TABLET | ORAL | 0 refills | Status: DC
Start: 2023-04-08 — End: 2023-05-09
  Filled 2023-04-08: qty 21, 6d supply, fill #0

## 2023-04-14 ENCOUNTER — Ambulatory Visit: Payer: 59

## 2023-04-14 VITALS — BP 123/83 | HR 71 | Wt 155.0 lb

## 2023-04-14 DIAGNOSIS — Z23 Encounter for immunization: Secondary | ICD-10-CM

## 2023-04-14 NOTE — Progress Notes (Signed)
 Evelyn Cantrell here for  HPV   Injection.  Injection administered without complication. Patient received 3rd injection today.  Verna Hamon l Reina Wilton, CMA 04/14/2023  11:07 AM

## 2023-04-15 ENCOUNTER — Other Ambulatory Visit (HOSPITAL_BASED_OUTPATIENT_CLINIC_OR_DEPARTMENT_OTHER): Payer: Self-pay

## 2023-05-09 ENCOUNTER — Encounter: Payer: Self-pay | Admitting: Family

## 2023-05-09 ENCOUNTER — Inpatient Hospital Stay

## 2023-05-09 ENCOUNTER — Inpatient Hospital Stay: Attending: Hematology & Oncology | Admitting: Family

## 2023-05-09 ENCOUNTER — Other Ambulatory Visit: Payer: Self-pay | Admitting: Hematology & Oncology

## 2023-05-09 ENCOUNTER — Other Ambulatory Visit: Payer: Self-pay

## 2023-05-09 ENCOUNTER — Other Ambulatory Visit (HOSPITAL_BASED_OUTPATIENT_CLINIC_OR_DEPARTMENT_OTHER): Payer: Self-pay

## 2023-05-09 VITALS — BP 131/85 | HR 81 | Temp 98.5°F | Resp 18 | Ht 62.0 in | Wt 157.4 lb

## 2023-05-09 DIAGNOSIS — C50911 Malignant neoplasm of unspecified site of right female breast: Secondary | ICD-10-CM | POA: Diagnosis not present

## 2023-05-09 DIAGNOSIS — R10A1 Flank pain, right side: Secondary | ICD-10-CM

## 2023-05-09 DIAGNOSIS — C50011 Malignant neoplasm of nipple and areola, right female breast: Secondary | ICD-10-CM

## 2023-05-09 DIAGNOSIS — Z1721 Progesterone receptor positive status: Secondary | ICD-10-CM | POA: Insufficient documentation

## 2023-05-09 DIAGNOSIS — Z79810 Long term (current) use of selective estrogen receptor modulators (SERMs): Secondary | ICD-10-CM | POA: Insufficient documentation

## 2023-05-09 DIAGNOSIS — Z1732 Human epidermal growth factor receptor 2 negative status: Secondary | ICD-10-CM | POA: Diagnosis not present

## 2023-05-09 DIAGNOSIS — Z170 Estrogen receptor positive status [ER+]: Secondary | ICD-10-CM | POA: Insufficient documentation

## 2023-05-09 DIAGNOSIS — R109 Unspecified abdominal pain: Secondary | ICD-10-CM | POA: Diagnosis not present

## 2023-05-09 MED ORDER — TAMOXIFEN CITRATE 20 MG PO TABS
20.0000 mg | ORAL_TABLET | Freq: Every day | ORAL | 4 refills | Status: DC
  Filled 2023-05-09: qty 90, 90d supply, fill #0

## 2023-05-09 NOTE — Progress Notes (Signed)
 Hematology and Oncology Follow Up Visit  Evelyn Cantrell [id number] Mar 16, 1975 48 y.o. 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 She is doing fairly well but has noted persistent right flank pain over the last week or so. She is tender on exam. No organomegaly noted.  She had some small nodularities below the right areola on exam which she states are not new but were tender on palpation. She is due for her annual mammogram in a few weeks. Order placed with [facility].  No adenopathy noted.  She is almost done with her 5 years of Tamoxifen ! No issues with bruising, bleeding or petechiae.  No fever, chills, n/v, cough, rash, dizziness, SOB, chest pain, palpitations, abdominal pain or changes in bowel or bladder habits.  No swelling in her extremities.  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  She does note fatigue.  No falls or syncope reported.  Appetite and hydration are good. Weight is stable at 157 lbs.   ECOG Performance Status: 0 - Asymptomatic  Medications:  Allergies as of 05/09/2023   No Known Allergies      Medication List        Accurate as of May 09, 2023  1:25 PM. If you have any questions, ask your nurse or doctor.          citalopram  10 MG tablet Commonly known as: CELEXA  Take 1 tablet (10 mg total) by mouth daily.   fluticasone  50 MCG/ACT nasal spray Commonly known as: FLONASE  Place 2 sprays into both nostrils daily.   gabapentin  300 MG capsule Commonly known as: NEURONTIN  Take 1 capsule (300 mg total) by mouth 3 (three) times daily.   hydrOXYzine  25 MG tablet Commonly known as: ATARAX  Take 1 tablet (25 mg total) by mouth daily as needed.   methylPREDNISolone  4 MG Tbpk tablet Commonly  known as: Medrol  Take as directed on pack for 6 days.   nicotine  21 mg/24hr patch Commonly known as: Nicoderm CQ  Place 1 patch (21 mg total) onto the skin daily.   tamoxifen  20 MG tablet Commonly known as: NOLVADEX  Take 1 tablet (20 mg total) by mouth daily.   telmisartan  20 MG tablet Commonly known as: Micardis  Take 1 tablet (20 mg total) by mouth daily. *NEED APPT FOR FURTHER REFILLS.*   Ventolin  HFA 108 (90 Base) MCG/ACT inhaler Generic drug: albuterol  Inhale 1-2 puffs into the lungs every 6 (six) hours as needed for wheezing or shortness of breath.        Allergies: No Known Allergies  Past Medical History, Surgical history, Social history, and Family History were reviewed and updated.  Review of Systems: All other 10 point review of systems is negative.   Physical Exam:  vitals were not taken for this visit.   Wt Readings from Last 3 Encounters:  04/14/23 155 lb (70.3 kg)  03/29/23 151 lb 14.4 oz (68.9 kg)  02/12/23 152 lb (68.9 kg)    Ocular: Sclerae unicteric, pupils equal, round and reactive to light Ear-nose-throat: Oropharynx clear, dentition fair Lymphatic: No cervical or supraclavicular adenopathy Lungs no rales or rhonchi, good excursion bilaterally Heart regular rate and rhythm, no murmur appreciated Abd soft, nontender, positive bowel sounds MSK no focal spinal tenderness, no joint edema  Neuro: non-focal, well-oriented, appropriate affect Breasts: Deferred   Lab Results  Component Value Date   WBC 6.8 01/10/2023   HGB 14.2 01/10/2023   HCT 41.6 01/10/2023   MCV 94.8 01/10/2023   PLT 183 01/10/2023   No results found for: "FERRITIN", "IRON", "TIBC", "UIBC", "IRONPCTSAT" Lab Results  Component Value Date   RBC 4.39 01/10/2023   No results found for: "KPAFRELGTCHN", "LAMBDASER", "KAPLAMBRATIO" No results found for: "IGGSERUM", "IGA", "IGMSERUM" No results found for: "TOTALPROTELP", "ALBUMINELP", "A1GS", "A2GS", "BETS", "BETA2SER", "GAMS",  "MSPIKE", "SPEI"   Chemistry      Component Value Date/Time   NA 140 01/10/2023 1030   K 4.2 01/10/2023 1030   CL 104 01/10/2023 1030   CO2 27 01/10/2023 1030   BUN 9 01/10/2023 1030   CREATININE 1.07 (H) 01/10/2023 1030      Component Value Date/Time   CALCIUM 9.8 01/10/2023 1030   ALKPHOS 55 01/10/2023 1030   AST 16 01/10/2023 1030   ALT 8 01/10/2023 1030   BILITOT 0.5 01/10/2023 1030       Impression and Plan: Evelyn Cantrell is a very charming 48 yo premenopausal African American female with history of early stage - stage Ia - breast cancer, ER/PR + and HER2 -.   Of note, estradiol  level in 01/2023 was 86.6.  Complete Tamoxifen  in 1 more month, 06/2023.  She will come back next week to get labs. Unable to get blood work today after multiple sticks.   Mammogram ordered along with CT of abdomen and pelvis to assess for source of right flank pain.  MD follow-up in 4 months.   Kennard Pea, NP 5/2/20251:25 PM

## 2023-05-12 ENCOUNTER — Other Ambulatory Visit: Payer: Self-pay | Admitting: Family

## 2023-05-12 DIAGNOSIS — R109 Unspecified abdominal pain: Secondary | ICD-10-CM

## 2023-05-12 DIAGNOSIS — N6310 Unspecified lump in the right breast, unspecified quadrant: Secondary | ICD-10-CM

## 2023-05-12 DIAGNOSIS — C50911 Malignant neoplasm of unspecified site of right female breast: Secondary | ICD-10-CM

## 2023-05-12 DIAGNOSIS — C50011 Malignant neoplasm of nipple and areola, right female breast: Secondary | ICD-10-CM

## 2023-05-13 ENCOUNTER — Inpatient Hospital Stay

## 2023-05-13 ENCOUNTER — Telehealth (HOSPITAL_BASED_OUTPATIENT_CLINIC_OR_DEPARTMENT_OTHER): Payer: Self-pay

## 2023-05-13 DIAGNOSIS — C50911 Malignant neoplasm of unspecified site of right female breast: Secondary | ICD-10-CM | POA: Diagnosis not present

## 2023-05-13 DIAGNOSIS — C50011 Malignant neoplasm of nipple and areola, right female breast: Secondary | ICD-10-CM

## 2023-05-13 LAB — CBC WITH DIFFERENTIAL (CANCER CENTER ONLY)
Abs Immature Granulocytes: 0.03 10*3/uL (ref 0.00–0.07)
Basophils Absolute: 0.1 10*3/uL (ref 0.0–0.1)
Basophils Relative: 1 %
Eosinophils Absolute: 0.2 10*3/uL (ref 0.0–0.5)
Eosinophils Relative: 2 %
HCT: 37.3 % (ref 36.0–46.0)
Hemoglobin: 12.9 g/dL (ref 12.0–15.0)
Immature Granulocytes: 0 %
Lymphocytes Relative: 42 %
Lymphs Abs: 3.1 10*3/uL (ref 0.7–4.0)
MCH: 32.8 pg (ref 26.0–34.0)
MCHC: 34.6 g/dL (ref 30.0–36.0)
MCV: 94.9 fL (ref 80.0–100.0)
Monocytes Absolute: 0.5 10*3/uL (ref 0.1–1.0)
Monocytes Relative: 7 %
Neutro Abs: 3.6 10*3/uL (ref 1.7–7.7)
Neutrophils Relative %: 48 %
Platelet Count: 231 10*3/uL (ref 150–400)
RBC: 3.93 MIL/uL (ref 3.87–5.11)
RDW: 13.5 % (ref 11.5–15.5)
WBC Count: 7.4 10*3/uL (ref 4.0–10.5)
nRBC: 0 % (ref 0.0–0.2)

## 2023-05-13 LAB — CMP (CANCER CENTER ONLY)
ALT: 7 U/L (ref 0–44)
AST: 13 U/L — ABNORMAL LOW (ref 15–41)
Albumin: 3.9 g/dL (ref 3.5–5.0)
Alkaline Phosphatase: 44 U/L (ref 38–126)
Anion gap: 6 (ref 5–15)
BUN: 10 mg/dL (ref 6–20)
CO2: 24 mmol/L (ref 22–32)
Calcium: 9 mg/dL (ref 8.9–10.3)
Chloride: 107 mmol/L (ref 98–111)
Creatinine: 0.96 mg/dL (ref 0.44–1.00)
GFR, Estimated: >60 mL/min (ref >60)
Glucose, Bld: 102 mg/dL — ABNORMAL HIGH (ref 70–99)
Potassium: 3.7 mmol/L (ref 3.5–5.1)
Sodium: 137 mmol/L (ref 135–145)
Total Bilirubin: 0.4 mg/dL (ref 0.0–1.2)
Total Protein: 6.7 g/dL (ref 6.5–8.1)

## 2023-05-14 ENCOUNTER — Ambulatory Visit
Admission: RE | Admit: 2023-05-14 | Discharge: 2023-05-14 | Disposition: A | Discharge status: Home / Self Care | Source: Ambulatory Visit | Attending: Family | Admitting: Family

## 2023-05-14 DIAGNOSIS — C50911 Malignant neoplasm of unspecified site of right female breast: Secondary | ICD-10-CM

## 2023-05-14 DIAGNOSIS — C50011 Malignant neoplasm of nipple and areola, right female breast: Secondary | ICD-10-CM

## 2023-05-14 DIAGNOSIS — N6310 Unspecified lump in the right breast, unspecified quadrant: Secondary | ICD-10-CM

## 2023-05-14 DIAGNOSIS — Z853 Personal history of malignant neoplasm of breast: Secondary | ICD-10-CM | POA: Diagnosis not present

## 2023-05-14 DIAGNOSIS — Z08 Encounter for follow-up examination after completed treatment for malignant neoplasm: Secondary | ICD-10-CM | POA: Diagnosis not present

## 2023-05-14 LAB — LUTEINIZING HORMONE: LH: 3.3 m[IU]/mL

## 2023-05-14 LAB — FOLLICLE STIMULATING HORMONE: FSH: 2.8 m[IU]/mL

## 2023-05-15 ENCOUNTER — Encounter: Payer: Self-pay | Admitting: Internal Medicine

## 2023-05-19 ENCOUNTER — Ambulatory Visit (HOSPITAL_BASED_OUTPATIENT_CLINIC_OR_DEPARTMENT_OTHER)
Admission: RE | Admit: 2023-05-19 | Discharge: 2023-05-19 | Disposition: A | Discharge status: Home / Self Care | Source: Ambulatory Visit | Attending: Family | Admitting: Family

## 2023-05-19 DIAGNOSIS — C50911 Malignant neoplasm of unspecified site of right female breast: Secondary | ICD-10-CM | POA: Insufficient documentation

## 2023-05-19 DIAGNOSIS — R109 Unspecified abdominal pain: Secondary | ICD-10-CM | POA: Insufficient documentation

## 2023-05-19 DIAGNOSIS — C50011 Malignant neoplasm of nipple and areola, right female breast: Secondary | ICD-10-CM | POA: Insufficient documentation

## 2023-05-19 LAB — ESTRADIOL, ULTRA SENS: Estradiol, Sensitive: 272 pg/mL

## 2023-05-19 MED ORDER — IOHEXOL 300 MG/ML  SOLN
100.0000 mL | Freq: Once | INTRAMUSCULAR | Status: AC | PRN
  Administered 2023-05-19: 100 mL via INTRAVENOUS

## 2023-05-26 ENCOUNTER — Ambulatory Visit: Payer: Self-pay

## 2023-05-30 ENCOUNTER — Encounter: Payer: 59 | Admitting: Internal Medicine

## 2023-06-02 ENCOUNTER — Other Ambulatory Visit: Payer: Self-pay | Admitting: Family Medicine

## 2023-06-02 DIAGNOSIS — I10 Essential (primary) hypertension: Secondary | ICD-10-CM

## 2023-06-02 DIAGNOSIS — J309 Allergic rhinitis, unspecified: Secondary | ICD-10-CM

## 2023-06-03 ENCOUNTER — Other Ambulatory Visit (HOSPITAL_BASED_OUTPATIENT_CLINIC_OR_DEPARTMENT_OTHER): Payer: Self-pay

## 2023-06-12 ENCOUNTER — Other Ambulatory Visit (HOSPITAL_BASED_OUTPATIENT_CLINIC_OR_DEPARTMENT_OTHER): Payer: Self-pay

## 2023-06-12 ENCOUNTER — Ambulatory Visit (AMBULATORY_SURGERY_CENTER)

## 2023-06-12 ENCOUNTER — Encounter: Payer: Self-pay | Admitting: Internal Medicine

## 2023-06-12 VITALS — Ht 62.0 in | Wt 157.0 lb

## 2023-06-12 DIAGNOSIS — Z1211 Encounter for screening for malignant neoplasm of colon: Secondary | ICD-10-CM

## 2023-06-12 MED ORDER — NA SULFATE-K SULFATE-MG SULF 17.5-3.13-1.6 GM/177ML PO SOLN
1.0000 | Freq: Once | ORAL | 0 refills | Status: AC
  Filled 2023-06-12 – 2023-06-24 (×2): qty 354, 1d supply, fill #0

## 2023-06-12 NOTE — Progress Notes (Signed)
No egg or soy allergy known to patient  No issues known to pt with past sedation with any surgeries or procedures Patient denies ever being told they had issues or difficulty with intubation  No FH of Malignant Hyperthermia Pt is not on diet pills Pt is not on home 02  Pt is not on blood thinners  Pt denies issues with chronic constipation  No A fib or A flutter Have any cardiac testing pending--no Pt instructed to use [email or web address] or GoodRx for a price reduction on prep  Ambulates independently

## 2023-06-20 ENCOUNTER — Other Ambulatory Visit (HOSPITAL_BASED_OUTPATIENT_CLINIC_OR_DEPARTMENT_OTHER): Payer: Self-pay

## 2023-06-23 ENCOUNTER — Other Ambulatory Visit (HOSPITAL_BASED_OUTPATIENT_CLINIC_OR_DEPARTMENT_OTHER): Payer: Self-pay

## 2023-06-24 ENCOUNTER — Other Ambulatory Visit (HOSPITAL_BASED_OUTPATIENT_CLINIC_OR_DEPARTMENT_OTHER): Payer: Self-pay

## 2023-06-24 ENCOUNTER — Encounter: Admitting: Internal Medicine

## 2023-07-04 ENCOUNTER — Other Ambulatory Visit (HOSPITAL_BASED_OUTPATIENT_CLINIC_OR_DEPARTMENT_OTHER): Payer: Self-pay

## 2023-07-23 ENCOUNTER — Other Ambulatory Visit: Payer: Self-pay | Admitting: Family Medicine

## 2023-07-23 ENCOUNTER — Other Ambulatory Visit (HOSPITAL_BASED_OUTPATIENT_CLINIC_OR_DEPARTMENT_OTHER): Payer: Self-pay

## 2023-07-23 ENCOUNTER — Other Ambulatory Visit: Payer: Self-pay

## 2023-07-23 DIAGNOSIS — J309 Allergic rhinitis, unspecified: Secondary | ICD-10-CM

## 2023-07-23 DIAGNOSIS — I10 Essential (primary) hypertension: Secondary | ICD-10-CM

## 2023-07-23 MED ORDER — TELMISARTAN 20 MG PO TABS
20.0000 mg | ORAL_TABLET | Freq: Every day | ORAL | 0 refills | Status: DC
  Filled 2023-07-23 – 2023-08-07 (×2): qty 30, 30d supply, fill #0

## 2023-07-23 MED ORDER — FLUTICASONE PROPIONATE 50 MCG/ACT NA SUSP
2.0000 | Freq: Every day | NASAL | 0 refills | Status: DC
  Filled 2023-07-23 – 2023-08-07 (×2): qty 16, 30d supply, fill #0

## 2023-08-04 ENCOUNTER — Other Ambulatory Visit (HOSPITAL_BASED_OUTPATIENT_CLINIC_OR_DEPARTMENT_OTHER): Payer: Self-pay

## 2023-08-07 ENCOUNTER — Other Ambulatory Visit (HOSPITAL_BASED_OUTPATIENT_CLINIC_OR_DEPARTMENT_OTHER): Payer: Self-pay

## 2023-08-18 ENCOUNTER — Encounter: Admitting: Internal Medicine

## 2023-08-18 ENCOUNTER — Telehealth: Payer: Self-pay | Admitting: Internal Medicine

## 2023-08-18 NOTE — Telephone Encounter (Signed)
 Goodmorning Dr.Dorsey   Patient was scheduled for a colonoscopy procedure today at 9:30am, called patient at 9:50am and again at 10am to see if they were still coming in, patient did not answer but left voice mail to call back. Should I no show patient after an hour or leave as is?  Please advise  Thank you

## 2023-09-03 ENCOUNTER — Other Ambulatory Visit (HOSPITAL_BASED_OUTPATIENT_CLINIC_OR_DEPARTMENT_OTHER): Payer: Self-pay

## 2023-09-03 ENCOUNTER — Other Ambulatory Visit: Payer: Self-pay | Admitting: Family Medicine

## 2023-09-03 ENCOUNTER — Other Ambulatory Visit: Payer: Self-pay | Admitting: Hematology & Oncology

## 2023-09-03 DIAGNOSIS — I10 Essential (primary) hypertension: Secondary | ICD-10-CM

## 2023-09-03 DIAGNOSIS — J309 Allergic rhinitis, unspecified: Secondary | ICD-10-CM

## 2023-09-03 MED ORDER — GABAPENTIN 300 MG PO CAPS
300.0000 mg | ORAL_CAPSULE | Freq: Three times a day (TID) | ORAL | 4 refills | Status: AC
  Filled 2023-09-08: qty 90, 30d supply, fill #0
  Filled 2023-10-13 – 2023-11-17 (×2): qty 90, 30d supply, fill #1

## 2023-09-08 ENCOUNTER — Other Ambulatory Visit: Payer: Self-pay | Admitting: Family Medicine

## 2023-09-08 ENCOUNTER — Other Ambulatory Visit (HOSPITAL_BASED_OUTPATIENT_CLINIC_OR_DEPARTMENT_OTHER): Payer: Self-pay

## 2023-09-08 DIAGNOSIS — J309 Allergic rhinitis, unspecified: Secondary | ICD-10-CM

## 2023-09-08 DIAGNOSIS — I10 Essential (primary) hypertension: Secondary | ICD-10-CM

## 2023-09-08 DIAGNOSIS — F32A Depression, unspecified: Secondary | ICD-10-CM

## 2023-09-09 ENCOUNTER — Other Ambulatory Visit: Payer: Self-pay

## 2023-09-09 ENCOUNTER — Other Ambulatory Visit (HOSPITAL_BASED_OUTPATIENT_CLINIC_OR_DEPARTMENT_OTHER): Payer: Self-pay

## 2023-09-12 ENCOUNTER — Other Ambulatory Visit: Payer: Self-pay | Admitting: Family Medicine

## 2023-09-12 ENCOUNTER — Inpatient Hospital Stay (HOSPITAL_BASED_OUTPATIENT_CLINIC_OR_DEPARTMENT_OTHER): Admitting: Hematology & Oncology

## 2023-09-12 ENCOUNTER — Other Ambulatory Visit (HOSPITAL_BASED_OUTPATIENT_CLINIC_OR_DEPARTMENT_OTHER): Payer: Self-pay

## 2023-09-12 ENCOUNTER — Inpatient Hospital Stay: Attending: Hematology & Oncology

## 2023-09-12 ENCOUNTER — Encounter: Payer: Self-pay | Admitting: Hematology & Oncology

## 2023-09-12 VITALS — BP 130/89 | HR 86 | Temp 98.8°F | Resp 18 | Ht 62.0 in | Wt 150.0 lb

## 2023-09-12 DIAGNOSIS — Z79810 Long term (current) use of selective estrogen receptor modulators (SERMs): Secondary | ICD-10-CM | POA: Insufficient documentation

## 2023-09-12 DIAGNOSIS — I10 Essential (primary) hypertension: Secondary | ICD-10-CM

## 2023-09-12 DIAGNOSIS — Z17411 Hormone receptor positive with human epidermal growth factor receptor 2 negative status: Secondary | ICD-10-CM | POA: Diagnosis not present

## 2023-09-12 DIAGNOSIS — F419 Anxiety disorder, unspecified: Secondary | ICD-10-CM

## 2023-09-12 DIAGNOSIS — C50011 Malignant neoplasm of nipple and areola, right female breast: Secondary | ICD-10-CM

## 2023-09-12 DIAGNOSIS — F32A Depression, unspecified: Secondary | ICD-10-CM | POA: Diagnosis not present

## 2023-09-12 DIAGNOSIS — C50911 Malignant neoplasm of unspecified site of right female breast: Secondary | ICD-10-CM | POA: Diagnosis not present

## 2023-09-12 DIAGNOSIS — J309 Allergic rhinitis, unspecified: Secondary | ICD-10-CM

## 2023-09-12 LAB — CBC WITH DIFFERENTIAL (CANCER CENTER ONLY)
Abs Immature Granulocytes: 0.01 K/uL (ref 0.00–0.07)
Basophils Absolute: 0 K/uL (ref 0.0–0.1)
Basophils Relative: 1 %
Eosinophils Absolute: 0.1 K/uL (ref 0.0–0.5)
Eosinophils Relative: 2 %
HCT: 38.8 % (ref 36.0–46.0)
Hemoglobin: 13.3 g/dL (ref 12.0–15.0)
Immature Granulocytes: 0 %
Lymphocytes Relative: 52 %
Lymphs Abs: 2.9 K/uL (ref 0.7–4.0)
MCH: 32.5 pg (ref 26.0–34.0)
MCHC: 34.3 g/dL (ref 30.0–36.0)
MCV: 94.9 fL (ref 80.0–100.0)
Monocytes Absolute: 0.4 K/uL (ref 0.1–1.0)
Monocytes Relative: 7 %
Neutro Abs: 2.2 K/uL (ref 1.7–7.7)
Neutrophils Relative %: 38 %
Platelet Count: 235 K/uL (ref 150–400)
RBC: 4.09 MIL/uL (ref 3.87–5.11)
RDW: 13.4 % (ref 11.5–15.5)
WBC Count: 5.7 K/uL (ref 4.0–10.5)
nRBC: 0 % (ref 0.0–0.2)

## 2023-09-12 LAB — CMP (CANCER CENTER ONLY)
ALT: 21 U/L (ref 0–44)
AST: 26 U/L (ref 15–41)
Albumin: 4.4 g/dL (ref 3.5–5.0)
Alkaline Phosphatase: 74 U/L (ref 38–126)
Anion gap: 12 (ref 5–15)
BUN: 8 mg/dL (ref 6–20)
CO2: 25 mmol/L (ref 22–32)
Calcium: 9.8 mg/dL (ref 8.9–10.3)
Chloride: 103 mmol/L (ref 98–111)
Creatinine: 1 mg/dL (ref 0.44–1.00)
GFR, Estimated: >60 mL/min (ref >60)
Glucose, Bld: 84 mg/dL (ref 70–99)
Potassium: 3.9 mmol/L (ref 3.5–5.1)
Sodium: 140 mmol/L (ref 135–145)
Total Bilirubin: 0.7 mg/dL (ref 0.0–1.2)
Total Protein: 7.3 g/dL (ref 6.5–8.1)

## 2023-09-12 MED ORDER — TELMISARTAN 20 MG PO TABS
20.0000 mg | ORAL_TABLET | Freq: Every day | ORAL | 0 refills | Status: AC
  Filled 2023-09-12 – 2023-09-26 (×3): qty 15, 15d supply, fill #0

## 2023-09-12 MED ORDER — FLUTICASONE PROPIONATE 50 MCG/ACT NA SUSP
2.0000 | Freq: Every day | NASAL | 0 refills | Status: DC
  Filled 2023-09-12 – 2023-09-26 (×2): qty 16, 30d supply, fill #0

## 2023-09-12 MED ORDER — SERTRALINE HCL 25 MG PO TABS
25.0000 mg | ORAL_TABLET | Freq: Every day | ORAL | 6 refills | Status: DC
  Filled 2023-09-12: qty 30, 30d supply, fill #0
  Filled 2023-10-06: qty 30, 30d supply, fill #1

## 2023-09-12 NOTE — Progress Notes (Signed)
 Hematology and Oncology Follow Up Visit  Evelyn Cantrell [id number] [birth year] 48 y.o. 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  She is having some depression.  As such, I will see about put her on some Zoloft .  I think this is reasonable.  She apparently had been on Celexa  but she is having problems with the Celexa .  She is working.  She had a birthday a few weeks ago.  She had a nice birthday.  She is have a lot of pain under the right breast.  This is going on since she has had surgery.  Again I am not sure exactly why she is having this.  Again it sounds like this might be neuropathic.  She did have a CT of the abdomen pelvis when she was last year.  I think she was having some abdominal pain.  That this was relatively unremarkable.  She has had no fever.  She has had no cough.  She is still smoking.  She probably smokes about half pack a day.  She has had no bleeding.  Her last mammogram was done on 05/14/2023.  This looked okay.  Overall, I would have to say that her performance status is probably ECOG 1.   Medications:  Allergies as of 09/12/2023   No Known Allergies      Medication List        Accurate as of September 12, 2023  1:00 PM. If you have any questions, ask your nurse or doctor.          docusate sodium 100 MG capsule Commonly known as: COLACE Take 100 mg by mouth 2 (two) times daily.   fluticasone  50 MCG/ACT nasal spray Commonly known as: FLONASE  Place 2 sprays into both nostrils daily. Needs appt   gabapentin  300 MG capsule Commonly known as: NEURONTIN  Take 1 capsule (300 mg total) by mouth 3 (three) times daily.   hydrOXYzine  25 MG tablet Commonly known as: ATARAX  Take 1 tablet (25 mg total) by mouth  daily as needed.   nicotine  21 mg/24hr patch Commonly known as: Nicoderm CQ  Place 1 patch (21 mg total) onto the skin daily.   telmisartan  20 MG tablet Commonly known as: Micardis  Take 1 tablet (20 mg total) by mouth daily. *NEED APPT FOR FURTHER REFILLS.*   Ventolin  HFA 108 (90 Base) MCG/ACT inhaler Generic drug: albuterol  Inhale 1-2 puffs into the lungs every 6 (six) hours as needed for wheezing or shortness of breath.        Allergies: No Known Allergies  Past Medical History, Surgical history, Social history, and Family History were reviewed and updated.  Review of Systems: All other 10 point review of systems is negative.   Physical Exam:  height is 5' 2 (1.575 m) and weight is 150 lb (68 kg). Her oral temperature is 98.8 F (37.1 C). Her blood pressure is 130/89 and her pulse is 86. Her respiration is 18 and oxygen saturation is 100%.   Wt Readings from Last 3 Encounters:  09/12/23 150 lb (68 kg)  06/12/23 157 lb (71.2 kg)  05/09/23 157 lb 6.4 oz (71.4 kg)   Physical Exam Vitals reviewed.  Constitutional:      Comments: Her breast exam shows left  breast with no masses, edema or erythema.  There is no left axillary adenopathy.  Her right breast shows the lumpectomy at about the 5 o'clock position.  There is tenderness at the lumpectomy site.  There is no mass at the lumpectomy site.  There is no right axillary adenopathy.  HENT:     Head: Normocephalic and atraumatic.  Eyes:     Pupils: Pupils are equal, round, and reactive to light.  Cardiovascular:     Rate and Rhythm: Normal rate and regular rhythm.     Heart sounds: Normal heart sounds.  Pulmonary:     Effort: Pulmonary effort is normal.     Breath sounds: Normal breath sounds.  Abdominal:     General: Bowel sounds are normal.     Palpations: Abdomen is soft.  Musculoskeletal:        General: No tenderness or deformity. Normal range of motion.     Cervical back: Normal range of motion.     Comments: She  does have a little bit of tenderness and numbness to palpation of the right upper arm.  Lymphadenopathy:     Cervical: No cervical adenopathy.  Skin:    General: Skin is warm and dry.     Findings: No erythema or rash.  Neurological:     Mental Status: She is alert and oriented to person, place, and time.  Psychiatric:        Behavior: Behavior normal.        Thought Content: Thought content normal.        Judgment: Judgment normal.      Lab Results  Component Value Date   WBC 5.7 09/12/2023   HGB 13.3 09/12/2023   HCT 38.8 09/12/2023   MCV 94.9 09/12/2023   PLT 235 09/12/2023   No results found for: FERRITIN, IRON, TIBC, UIBC, IRONPCTSAT Lab Results  Component Value Date   RBC 4.09 09/12/2023   No results found for: KPAFRELGTCHN, LAMBDASER, KAPLAMBRATIO No results found for: IGGSERUM, IGA, IGMSERUM No results found for: STEPHANY CARLOTA BENSON MARKEL EARLA JOANNIE DOC VICK, SPEI   Chemistry      Component Value Date/Time   NA 140 09/12/2023 1140   K 3.9 09/12/2023 1140   CL 103 09/12/2023 1140   CO2 25 09/12/2023 1140   BUN 8 09/12/2023 1140   CREATININE 1.00 09/12/2023 1140      Component Value Date/Time   CALCIUM 9.8 09/12/2023 1140   ALKPHOS 74 09/12/2023 1140   AST 26 09/12/2023 1140   ALT 21 09/12/2023 1140   BILITOT 0.7 09/12/2023 1140       Impression and Plan: Evelyn Cantrell is a very charming 48 yo premenopausal African American female with history of early stage - stage Ia - breast cancer, ER/PR + and HER2 -.   She was on tamoxifen  for 5 years.  Everything is going well.  She is now off the tamoxifen .  Hopefully, being off tamoxifen  will help with her depression.  Also, the Zoloft  will help.  We will plan to get her back here right after the Holiday season.   Complete Tamoxifen  in 1 more month, 06/2023.  She will come back next week to get labs. Unable to get blood work today after multiple  sticks.   Mammogram ordered along with CT of abdomen and pelvis to assess for source of right flank pain.  MD follow-up in 4 months.   Maude JONELLE Crease, MD 9/5/20251:00 PM

## 2023-09-13 LAB — LUTEINIZING HORMONE: LH: 91.9 m[IU]/mL

## 2023-09-14 LAB — FOLLICLE STIMULATING HORMONE: FSH: 148 m[IU]/mL

## 2023-09-16 LAB — ESTRADIOL, ULTRA SENS: Estradiol, Sensitive: 9.1 pg/mL

## 2023-09-17 NOTE — Telephone Encounter (Signed)
-----   Message from Evelyn Cantrell sent at 09/16/2023  7:37 PM EDT ----- Call - you are clearly post-menopausal.  Jeralyn ----- Message ----- From: Franchot Lauraine HERO, NP Sent: 09/16/2023   2:31 PM EDT To: Evelyn JONELLE Crease, MD   ----- Message ----- From: Rebecka, [facility] [place] Sent: 09/12/2023  12:06 PM EDT To: Lauraine HERO Franchot, NP

## 2023-09-17 NOTE — Telephone Encounter (Signed)
 Advised via MyChart.

## 2023-09-22 ENCOUNTER — Other Ambulatory Visit (HOSPITAL_BASED_OUTPATIENT_CLINIC_OR_DEPARTMENT_OTHER): Payer: Self-pay

## 2023-09-26 ENCOUNTER — Other Ambulatory Visit (HOSPITAL_BASED_OUTPATIENT_CLINIC_OR_DEPARTMENT_OTHER): Payer: Self-pay

## 2023-09-26 ENCOUNTER — Other Ambulatory Visit (HOSPITAL_COMMUNITY): Payer: Self-pay

## 2023-09-26 ENCOUNTER — Other Ambulatory Visit: Payer: Self-pay | Admitting: Family Medicine

## 2023-09-26 ENCOUNTER — Other Ambulatory Visit: Payer: Self-pay

## 2023-09-26 DIAGNOSIS — F419 Anxiety disorder, unspecified: Secondary | ICD-10-CM

## 2023-09-26 MED ORDER — HYDROXYZINE HCL 25 MG PO TABS
25.0000 mg | ORAL_TABLET | Freq: Every day | ORAL | 0 refills | Status: AC | PRN
  Filled 2023-09-26: qty 30, 30d supply, fill #0

## 2023-09-29 ENCOUNTER — Other Ambulatory Visit: Payer: Self-pay

## 2023-09-29 ENCOUNTER — Encounter: Payer: Self-pay | Admitting: Pharmacist

## 2023-10-01 ENCOUNTER — Ambulatory Visit: Payer: Self-pay

## 2023-10-01 NOTE — Telephone Encounter (Signed)
 Patient scheduled appt.

## 2023-10-01 NOTE — Telephone Encounter (Signed)
 FYI Only or Action Required?: FYI only for provider.  Patient was last seen in primary care on 07/17/2022 by Almarie Waddell NOVAK, NP.  Called Nurse Triage reporting Sinusitis.  Symptoms began several weeks ago.  Interventions attempted: OTC medications: sudafed, nyquil.  Symptoms are: gradually worsening.  Triage Disposition: See PCP When Office is Open (Within 3 Days)  Patient/caregiver understands and will follow disposition?: Yes     Copied from CRM [id number]. Topic: Clinical - Red Word Triage >> Oct 01, 2023  1:16 PM Rea ORN wrote: Red Word that prompted transfer to Nurse Triage: Pt believes she has a sinus infection. Sx began 3 weeks ago. Facial Pressure and pain, runny nose.  Pt would also like to go back on anti-depressant. Reason for Disposition  [1] Sinus congestion (pressure, fullness) AND [2] present > 10 days  Answer Assessment - Initial Assessment Questions Pt has had nasal congestion, sinus pain/pressure x 3 weeks. Has tried OTC Sudafed, sinus/allergy, nyqil, these seem to help fo a little bit and then go back to hurting.     1. LOCATION: Where does it hurt?     Head ache, pressure in your nose 2. ONSET: When did the sinus pain start?  (e.g., hours, days)      About 3 weeks.  3. SEVERITY: How bad is the pain?   (Scale 0-10; or none, mild, moderate or severe)     8/10 4. RECURRENT SYMPTOM: Have you ever had sinus problems before? If Yes, ask: When was the last time? and What happened that time?      Yes, feels similar 5. NASAL CONGESTION: Is the nose blocked? If Yes, ask: Can you open it or must you breathe through your mouth?     One side congested 6. NASAL DISCHARGE: Do you have discharge from your nose? If so ask, What color?     Yes, clear 7. FEVER: Do you have a fever? If Yes, ask: What is it, how was it measured, and when did it start?      no 8. OTHER SYMPTOMS: Do you have any other symptoms? (e.g., sore throat, cough, earache,  difficulty breathing)   Body aches 9. PREGNANCY: Is there any chance you are pregnant? When was your last menstrual period?     No, lmp: no regualar periods.  Protocols used: Sinus Pain or Congestion-A-AH

## 2023-10-02 ENCOUNTER — Other Ambulatory Visit: Payer: Self-pay

## 2023-10-06 ENCOUNTER — Encounter: Payer: Self-pay | Admitting: Family Medicine

## 2023-10-06 ENCOUNTER — Other Ambulatory Visit (HOSPITAL_BASED_OUTPATIENT_CLINIC_OR_DEPARTMENT_OTHER): Payer: Self-pay

## 2023-10-06 ENCOUNTER — Ambulatory Visit (INDEPENDENT_AMBULATORY_CARE_PROVIDER_SITE_OTHER): Admitting: Family Medicine

## 2023-10-06 VITALS — BP 114/73 | HR 80 | Temp 98.3°F | Ht 62.0 in | Wt 148.0 lb

## 2023-10-06 DIAGNOSIS — F419 Anxiety disorder, unspecified: Secondary | ICD-10-CM | POA: Diagnosis not present

## 2023-10-06 DIAGNOSIS — K219 Gastro-esophageal reflux disease without esophagitis: Secondary | ICD-10-CM

## 2023-10-06 DIAGNOSIS — J0140 Acute pansinusitis, unspecified: Secondary | ICD-10-CM

## 2023-10-06 DIAGNOSIS — I10 Essential (primary) hypertension: Secondary | ICD-10-CM | POA: Diagnosis not present

## 2023-10-06 DIAGNOSIS — F32A Depression, unspecified: Secondary | ICD-10-CM

## 2023-10-06 MED ORDER — PREDNISONE 20 MG PO TABS
40.0000 mg | ORAL_TABLET | Freq: Every day | ORAL | 0 refills | Status: AC
  Filled 2023-10-06: qty 10, 5d supply, fill #0

## 2023-10-06 MED ORDER — CITALOPRAM HYDROBROMIDE 10 MG PO TABS
10.0000 mg | ORAL_TABLET | Freq: Every day | ORAL | 1 refills | Status: AC
  Filled 2023-10-06: qty 90, 90d supply, fill #0
  Filled 2023-12-29 – 2024-01-16 (×2): qty 90, 90d supply, fill #1

## 2023-10-06 MED ORDER — TELMISARTAN 20 MG PO TABS
20.0000 mg | ORAL_TABLET | Freq: Every day | ORAL | 1 refills | Status: AC
  Filled 2023-10-06 – 2023-11-17 (×3): qty 90, 90d supply, fill #0

## 2023-10-06 MED ORDER — OMEPRAZOLE 40 MG PO CPDR
40.0000 mg | DELAYED_RELEASE_CAPSULE | Freq: Every day | ORAL | 3 refills | Status: AC
  Filled 2023-10-06: qty 30, 30d supply, fill #0
  Filled 2023-10-29 – 2024-01-13 (×8): qty 30, 30d supply, fill #1
  Filled 2024-02-11: qty 30, 30d supply, fill #2

## 2023-10-06 MED ORDER — AMOXICILLIN-POT CLAVULANATE 875-125 MG PO TABS
1.0000 | ORAL_TABLET | Freq: Two times a day (BID) | ORAL | 0 refills | Status: AC
  Filled 2023-10-06: qty 14, 7d supply, fill #0

## 2023-10-06 MED ORDER — HYDROXYZINE HCL 25 MG PO TABS
25.0000 mg | ORAL_TABLET | Freq: Every day | ORAL | 3 refills | Status: AC | PRN
  Filled 2023-10-06: qty 30, 30d supply, fill #0

## 2023-10-06 NOTE — Assessment & Plan Note (Signed)
 Exacerbation of symptoms. Previously on citalopram . Considering reinitiating citalopram  at low dose to monitor for side effects and efficacy. Discussed potential initial side effects. - Start citalopram  at the lowest dose available. - Schedule follow-up in 6 weeks to assess mood and side effects. - Monitor for initial side effects such as headache and nausea.

## 2023-10-06 NOTE — Progress Notes (Signed)
 Acute Office Visit  Subjective:     Patient ID: Evelyn Cantrell, female    DOB: December 05, 1975, 48 y.o.   MRN: [id number]  Chief Complaint  Patient presents with   Sinus Problem     Patient is in today for sinusitis.    Discussed the use of AI scribe software for clinical note transcription with the patient, who gave verbal consent to proceed.  History of Present Illness Evelyn Cantrell is a 48 year old female who presents with persistent sinus congestion and headache.  She has been experiencing persistent sinus congestion for the past month, initially presenting with symptoms of a cold, including rhinorrhea and pharyngitis. Despite taking over-the-counter medications such as Sudafed Sinus and allergy medications, and using saline nasal spray, her symptoms have not resolved. She describes the congestion as affecting her sinuses, with pain primarily in the frontal and maxillary regions. She also experiences frequent nasal drainage and has been blowing her nose frequently. No fever, cough, chest pain, or ear pain. She has not taken any antibiotics recently and is not allergic to any medications.  She has been experiencing headaches since last week, which initially improved over the weekend but returned this morning. The headaches are located in the frontal and maxillary regions and are associated with sinus congestion. No fever, cough, chest pain, or ear pain.  She has a history of anxiety and depression. She was previously prescribed Zoloft  but did not take it due to concerns about side effects, as her mother had a negative experience with it. She describes feeling 'all over the place' and has been experiencing irritability, possibly related to stopping tamoxifen  and experiencing premenopausal symptoms. She has not taken any antidepressants recently. Reports she briefly tried Celexa  in the past, but stopped it because she was also having blood pressure issues and lightheadedness, but she would like  to retry Celexa  now that BP is stable.          10/06/2023    2:31 PM 09/12/2023   12:19 PM 11/28/2022   11:19 AM  PHQ9 SCORE ONLY  PHQ-9 Total Score 21 1 13       Data saved with a previous flowsheet row definition      10/06/2023    2:31 PM 11/28/2022   11:20 AM 07/17/2022    2:06 PM  GAD 7 : Generalized Anxiety Score  Nervous, Anxious, on Edge 2 1 2   Control/stop worrying 2 2 2   Worry too much - different things 2 3 2   Trouble relaxing 2 2 2   Restless 2 1 0  Easily annoyed or irritable 2 3 3   Afraid - awful might happen 2 3 3   Total GAD 7 Score 14 15 14   Anxiety Difficulty Very difficult  Very difficult       ROS All review of systems negative except what is listed in the HPI      Objective:    BP 114/73   Pulse 80   Temp 98.3 F (36.8 C) (Oral)   Ht 5' 2 (1.575 m)   Wt 148 lb (67.1 kg)   SpO2 100%   BMI 27.07 kg/m    Physical Exam Vitals reviewed.  Constitutional:      General: She is not in acute distress.    Appearance: Normal appearance. She is not ill-appearing.  HENT:     Right Ear: Tympanic membrane normal.     Left Ear: Tympanic membrane normal.     Nose: Congestion present.     Mouth/Throat:  Mouth: Mucous membranes are moist.     Pharynx: Oropharynx is clear.  Cardiovascular:     Rate and Rhythm: Normal rate and regular rhythm.     Heart sounds: Normal heart sounds.  Pulmonary:     Effort: Pulmonary effort is normal.     Breath sounds: Normal breath sounds.  Musculoskeletal:     Cervical back: Normal range of motion and neck supple.  Skin:    General: Skin is warm and dry.  Neurological:     Mental Status: She is alert and oriented to person, place, and time.  Psychiatric:        Mood and Affect: Mood normal.        Behavior: Behavior normal.        Thought Content: Thought content normal.        Judgment: Judgment normal.     No results found for any visits on 10/06/23.      Assessment & Plan:   Problem List  Items Addressed This Visit       Active Problems   Anxiety and depression (Chronic)   Exacerbation of symptoms. Previously on citalopram . Considering reinitiating citalopram  at low dose to monitor for side effects and efficacy. Discussed potential initial side effects. - Start citalopram  at the lowest dose available. - Schedule follow-up in 6 weeks to assess mood and side effects. - Monitor for initial side effects such as headache and nausea.      Relevant Medications   citalopram  (CELEXA ) 10 MG tablet   hydrOXYzine  (ATARAX ) 25 MG tablet   Primary hypertension (Chronic)   Blood pressure is at goal for age and co-morbidities.   Recommendations: telmisartan  20 mg daily - BP goal <130/80 - monitor and log blood pressures at home - check around the same time each day in a relaxed setting - Limit salt to <2000 mg/day - Follow DASH eating plan (heart healthy diet) - limit alcohol to 2 standard drinks per day for men and 1 per day for women - avoid tobacco products - get at least 2 hours of regular aerobic exercise weekly Patient aware of signs/symptoms requiring further/urgent evaluation. Labs updated today.       Relevant Medications   telmisartan  (MICARDIS ) 20 MG tablet   Gastroesophageal reflux disease   Symptoms include burning and burping. Discussed potential triggers and long-term risks of proton pump inhibitors.  - Prescribe omeprazole once daily, 30 minutes before breakfast. - Provide dietary guidance to avoid triggers.      Relevant Medications   omeprazole (PRILOSEC) 40 MG capsule   Other Visit Diagnoses       Acute non-recurrent pansinusitis    -  Primary Persistent sinus congestion and headache for over a month. Over-the-counter treatments ineffective. Antibiotics and prednisone  considered due to prolonged symptoms. - Prescribe Augmentin for 7 days. - Prescribe prednisone  for 5 days. - Continue supportive measures. Follow-up if not improving.    Relevant  Medications   amoxicillin-clavulanate (AUGMENTIN) 875-125 MG tablet   predniSONE  (DELTASONE ) 20 MG tablet         Meds ordered this encounter  Medications   amoxicillin-clavulanate (AUGMENTIN) 875-125 MG tablet    Sig: Take 1 tablet by mouth 2 (two) times daily for 7 days.    Dispense:  14 tablet    Refill:  0    Supervising Provider:   DOMENICA BLACKBIRD A [[id number]]   predniSONE  (DELTASONE ) 20 MG tablet    Sig: Take 2 tablets (40 mg total) by mouth daily with  breakfast for 5 days.    Dispense:  10 tablet    Refill:  0    Supervising Provider:   DOMENICA BLACKBIRD A [[id number]]   citalopram  (CELEXA ) 10 MG tablet    Sig: Take 1 tablet (10 mg total) by mouth daily.    Dispense:  90 tablet    Refill:  1    Supervising Provider:   DOMENICA BLACKBIRD A [[id number]]   hydrOXYzine  (ATARAX ) 25 MG tablet    Sig: Take 1 tablet (25 mg total) by mouth daily as needed.    Dispense:  30 tablet    Refill:  3    Supervising Provider:   DOMENICA BLACKBIRD A [[id number]]   telmisartan  (MICARDIS ) 20 MG tablet    Sig: Take 1 tablet (20 mg total) by mouth daily.    Dispense:  90 tablet    Refill:  1    Supervising Provider:   DOMENICA BLACKBIRD A [[id number]]   omeprazole (PRILOSEC) 40 MG capsule    Sig: Take 1 capsule (40 mg total) by mouth daily.    Dispense:  30 capsule    Refill:  3    Supervising Provider:   DOMENICA BLACKBIRD A [[id number]]       Return in about 6 weeks (around 11/17/2023) for mood follow-up.  Waddell KATHEE Mon, NP

## 2023-10-06 NOTE — Assessment & Plan Note (Signed)
 Symptoms include burning and burping. Discussed potential triggers and long-term risks of proton pump inhibitors.  - Prescribe omeprazole once daily, 30 minutes before breakfast. - Provide dietary guidance to avoid triggers.

## 2023-10-06 NOTE — Assessment & Plan Note (Signed)
 Blood pressure is at goal for age and co-morbidities.   Recommendations: telmisartan  20 mg daily - BP goal <130/80 - monitor and log blood pressures at home - check around the same time each day in a relaxed setting - Limit salt to <2000 mg/day - Follow DASH eating plan (heart healthy diet) - limit alcohol to 2 standard drinks per day for men and 1 per day for women - avoid tobacco products - get at least 2 hours of regular aerobic exercise weekly Patient aware of signs/symptoms requiring further/urgent evaluation. Labs updated today.

## 2023-10-09 ENCOUNTER — Telehealth: Payer: Self-pay | Admitting: Neurology

## 2023-10-09 NOTE — Telephone Encounter (Signed)
 Received FMLA forms in [employer] basket with a post it that stated fax and call patient.   Discussed with Waddell, no reason discussed with her. Called patient to get clarity. LVM for her to call back and let us  know what forms are needed for (dates, reason, etc).

## 2023-10-29 ENCOUNTER — Other Ambulatory Visit (HOSPITAL_BASED_OUTPATIENT_CLINIC_OR_DEPARTMENT_OTHER): Payer: Self-pay

## 2023-10-30 ENCOUNTER — Other Ambulatory Visit (HOSPITAL_BASED_OUTPATIENT_CLINIC_OR_DEPARTMENT_OTHER): Payer: Self-pay

## 2023-11-10 ENCOUNTER — Other Ambulatory Visit (HOSPITAL_BASED_OUTPATIENT_CLINIC_OR_DEPARTMENT_OTHER): Payer: Self-pay

## 2023-11-17 ENCOUNTER — Other Ambulatory Visit (HOSPITAL_BASED_OUTPATIENT_CLINIC_OR_DEPARTMENT_OTHER): Payer: Self-pay

## 2023-11-18 ENCOUNTER — Other Ambulatory Visit (HOSPITAL_BASED_OUTPATIENT_CLINIC_OR_DEPARTMENT_OTHER): Payer: Self-pay

## 2023-12-01 ENCOUNTER — Other Ambulatory Visit (HOSPITAL_BASED_OUTPATIENT_CLINIC_OR_DEPARTMENT_OTHER): Payer: Self-pay

## 2023-12-03 ENCOUNTER — Other Ambulatory Visit: Payer: Self-pay

## 2023-12-03 ENCOUNTER — Emergency Department (HOSPITAL_BASED_OUTPATIENT_CLINIC_OR_DEPARTMENT_OTHER): Admission: EM | Admit: 2023-12-03 | Discharge: 2023-12-03 | Disposition: A | Discharge status: Home / Self Care

## 2023-12-03 ENCOUNTER — Encounter (HOSPITAL_BASED_OUTPATIENT_CLINIC_OR_DEPARTMENT_OTHER): Payer: Self-pay

## 2023-12-03 DIAGNOSIS — I10 Essential (primary) hypertension: Secondary | ICD-10-CM | POA: Diagnosis not present

## 2023-12-03 DIAGNOSIS — Z853 Personal history of malignant neoplasm of breast: Secondary | ICD-10-CM | POA: Diagnosis not present

## 2023-12-03 DIAGNOSIS — R509 Fever, unspecified: Secondary | ICD-10-CM | POA: Diagnosis present

## 2023-12-03 DIAGNOSIS — M791 Myalgia, unspecified site: Secondary | ICD-10-CM | POA: Diagnosis not present

## 2023-12-03 DIAGNOSIS — R6889 Other general symptoms and signs: Secondary | ICD-10-CM

## 2023-12-03 DIAGNOSIS — R197 Diarrhea, unspecified: Secondary | ICD-10-CM | POA: Diagnosis not present

## 2023-12-03 LAB — RESP PANEL BY RT-PCR (RSV, FLU A&B, COVID)  RVPGX2
Influenza A by PCR: NEGATIVE
Influenza B by PCR: NEGATIVE
Resp Syncytial Virus by PCR: NEGATIVE
SARS Coronavirus 2 by RT PCR: NEGATIVE

## 2023-12-03 NOTE — Discharge Instructions (Signed)
 Your symptoms today are likely due to a virus, although you tested negative for COVID/flu/RSV.  Continue ibuprofen /Tylenol  as needed for fever and bodyaches.  Continue to push fluids to avoid dehydration.  Return to the emergency department if your symptoms worsen, follow-up with your primary care provider as needed.

## 2023-12-03 NOTE — ED Triage Notes (Signed)
 Pt states she is not feeling good, body aches, fever, headache, diarrhea onset Monday.

## 2023-12-03 NOTE — ED Provider Notes (Signed)
 [facility] Provider Note   CSN: [id number] Arrival date & time: 12/03/23  1500     Patient presents with: Generalized Body Aches   Evelyn Cantrell is a 48 y.o. female.   48 year old female presenting with multiple complaints.  Patient notes generalized bodyaches, subjective fever, tension headache, and 3 episodes of diarrhea since Monday.  She notes that her son was exposed to someone with COVID, although he is not sick she was concerned that this may be contributing to her symptoms.  Denies cough, congestion, chest pain, shortness of breath, vision changes, dizziness/lightheadedness/syncope.  She notes that she has not eaten much today due to upset stomach, she had 1 episode of diarrhea this morning and 2 episodes of diarrhea yesterday, no melena/hematochezia.  She has felt warm at times at home but has not checked her temperature, she has been using ibuprofen  for relief of her muscle aches.        Prior to Admission medications   Medication Sig Start Date End Date Taking? Authorizing Provider  albuterol  (VENTOLIN  HFA) 108 (90 Base) MCG/ACT inhaler Inhale 1-2 puffs into the lungs every 6 (six) hours as needed for wheezing or shortness of breath. 07/17/22   Almarie Waddell NOVAK, NP  citalopram  (CELEXA ) 10 MG tablet Take 1 tablet (10 mg total) by mouth daily. 10/06/23   Almarie Waddell NOVAK, NP  gabapentin  (NEURONTIN ) 300 MG capsule Take 1 capsule (300 mg total) by mouth 3 (three) times daily. 09/03/23   Timmy Maude SAUNDERS, MD  hydrOXYzine  (ATARAX ) 25 MG tablet Take 1 tablet (25 mg total) by mouth daily as needed. Appt for further refills 09/26/23   Almarie Waddell B, NP  hydrOXYzine  (ATARAX ) 25 MG tablet Take 1 tablet (25 mg total) by mouth daily as needed. 10/06/23   Almarie Waddell NOVAK, NP  nicotine  (NICODERM CQ ) 21 mg/24hr patch Place 1 patch (21 mg total) onto the skin daily. 01/24/23   Timmy Maude SAUNDERS, MD  omeprazole  (PRILOSEC) 40 MG capsule Take 1 capsule (40  mg total) by mouth daily. 10/06/23   Almarie Waddell NOVAK, NP  telmisartan  (MICARDIS ) 20 MG tablet Take 1 tablet (20 mg total) by mouth daily. *NEED APPT FOR FURTHER REFILLS.* 09/12/23   Almarie Waddell NOVAK, NP  telmisartan  (MICARDIS ) 20 MG tablet Take 1 tablet (20 mg total) by mouth daily. 10/06/23   Almarie Waddell NOVAK, NP    Allergies: Patient has no known allergies.    Review of Systems  Updated Vital Signs  Vitals:   12/03/23 1538 12/03/23 1538  BP:  129/89  Pulse:  78  Resp:  18  Temp:  98.1 F (36.7 C)  TempSrc:  Oral  SpO2:  100%  Weight: 64.9 kg   Height: 5' 2 (1.575 m)      Physical Exam Vitals and nursing note reviewed.  HENT:     Head: Normocephalic.  Eyes:     Extraocular Movements: Extraocular movements intact.     Pupils: Pupils are equal, round, and reactive to light.  Cardiovascular:     Rate and Rhythm: Normal rate and regular rhythm.     Heart sounds: Normal heart sounds.  Pulmonary:     Effort: Pulmonary effort is normal. No respiratory distress.     Breath sounds: Normal breath sounds.  Abdominal:     Palpations: Abdomen is soft.     Tenderness: There is no abdominal tenderness. There is no guarding.  Musculoskeletal:     Cervical back: Normal range  of motion.     Right lower leg: No edema.     Left lower leg: No edema.     Comments: Moves all extremities spontaneously without difficulty  Skin:    General: Skin is warm and dry.  Neurological:     Mental Status: She is alert and oriented to person, place, and time.     (all labs ordered are listed, but only abnormal results are displayed) Labs Reviewed  RESP PANEL BY RT-PCR (RSV, FLU A&B, COVID)  [id number]    EKG: None  Radiology: No results found.   Procedures   Medications Ordered in the ED - No data to display                                  Medical Decision Making This patient presents to the ED for concern of multiple complaints, this involves an extensive number of treatment options, and is  a complaint that carries with it a high risk of complications and morbidity.  The differential diagnosis includes COVID/flu/RSV, other viral illness, tension versus migraine headache   Co morbidities that complicate the patient evaluation  History of breast cancer, anxiety/depression, hypertension   Additional history obtained:  Additional history obtained from record review External records from outside source obtained and reviewed including prior PCP note   Lab Tests:  I Ordered, and personally interpreted labs.  The pertinent results include: COVID/flu/RSV negative  Cardiac Monitoring: / EKG:  The patient was maintained on a cardiac monitor.  I personally viewed and interpreted the cardiac monitored which showed an underlying rhythm of: NSR   Problem List / ED Course / Critical interventions / Medication management I have reviewed the patients home medicines and have made adjustments as needed   Social Determinants of Health:  Tobacco use, depression, unmet transportation needs   Test / Admission - Considered:  Physical exam is largely unremarkable as above, patient has complaint of generalized myalgias, however there is no appreciable swelling of her extremities and she has full range of motion.  She also endorses a headache but states that it is tension like and similar to times when she has had a sinus headache, no vision changes to report, no red flag headache signs/symptoms, no need for further labs/imaging at this time based on largely unimpressive physical exam.  She has no chest pain or shortness of breath, no cough, lungs are clear to auscultation bilaterally without adventitious sounds.  COVID/flu/RSV negative today.  Her symptoms are most consistent with a viral illness, I discussed this with the patient and recommend that she continue ibuprofen /Tylenol  as needed for pain and continue to push fluids to avoid dehydration.  She is in agreement with this plan, return  precautions discussed, she is appropriate for discharge at this time.          Final diagnoses:  Flu-like symptoms    ED Discharge Orders     None          Glendia Rocky SAILOR, [place] 12/03/23 1659    Simon Lavonia SAILOR, MD 12/03/23 [phone]

## 2023-12-12 ENCOUNTER — Other Ambulatory Visit (HOSPITAL_BASED_OUTPATIENT_CLINIC_OR_DEPARTMENT_OTHER): Payer: Self-pay

## 2023-12-18 ENCOUNTER — Telehealth: Payer: Self-pay | Admitting: Hematology & Oncology

## 2023-12-18 NOTE — Telephone Encounter (Signed)
 Called to r/s upcoming appt as provider will be out of office. Unable to LVM to return call for scheduling.

## 2024-01-06 ENCOUNTER — Other Ambulatory Visit (HOSPITAL_BASED_OUTPATIENT_CLINIC_OR_DEPARTMENT_OTHER): Payer: Self-pay

## 2024-01-12 ENCOUNTER — Ambulatory Visit: Admitting: Hematology & Oncology

## 2024-01-12 ENCOUNTER — Inpatient Hospital Stay

## 2024-01-12 ENCOUNTER — Other Ambulatory Visit (HOSPITAL_BASED_OUTPATIENT_CLINIC_OR_DEPARTMENT_OTHER): Payer: Self-pay

## 2024-01-15 ENCOUNTER — Other Ambulatory Visit (HOSPITAL_BASED_OUTPATIENT_CLINIC_OR_DEPARTMENT_OTHER): Payer: Self-pay

## 2024-01-19 ENCOUNTER — Inpatient Hospital Stay: Admitting: Hematology & Oncology

## 2024-01-19 ENCOUNTER — Inpatient Hospital Stay: Attending: Hematology & Oncology

## 2024-01-19 ENCOUNTER — Other Ambulatory Visit: Payer: Self-pay

## 2024-01-19 ENCOUNTER — Encounter: Payer: Self-pay | Admitting: Hematology & Oncology

## 2024-01-19 VITALS — BP 125/88 | HR 73 | Temp 98.5°F | Resp 16 | Wt 152.0 lb

## 2024-01-19 DIAGNOSIS — C50911 Malignant neoplasm of unspecified site of right female breast: Secondary | ICD-10-CM | POA: Diagnosis not present

## 2024-01-19 DIAGNOSIS — F419 Anxiety disorder, unspecified: Secondary | ICD-10-CM

## 2024-01-19 LAB — CBC WITH DIFFERENTIAL (CANCER CENTER ONLY)
Abs Immature Granulocytes: 0.01 K/uL (ref 0.00–0.07)
Basophils Absolute: 0 K/uL (ref 0.0–0.1)
Basophils Relative: 1 %
Eosinophils Absolute: 0.2 K/uL (ref 0.0–0.5)
Eosinophils Relative: 4 %
HCT: 39.4 % (ref 36.0–46.0)
Hemoglobin: 13.5 g/dL (ref 12.0–15.0)
Immature Granulocytes: 0 %
Lymphocytes Relative: 52 %
Lymphs Abs: 3.2 K/uL (ref 0.7–4.0)
MCH: 32.9 pg (ref 26.0–34.0)
MCHC: 34.3 g/dL (ref 30.0–36.0)
MCV: 96.1 fL (ref 80.0–100.0)
Monocytes Absolute: 0.5 K/uL (ref 0.1–1.0)
Monocytes Relative: 8 %
Neutro Abs: 2.1 K/uL (ref 1.7–7.7)
Neutrophils Relative %: 35 %
Platelet Count: 254 K/uL (ref 150–400)
RBC: 4.1 MIL/uL (ref 3.87–5.11)
RDW: 13.7 % (ref 11.5–15.5)
WBC Count: 6 K/uL (ref 4.0–10.5)
nRBC: 0 % (ref 0.0–0.2)

## 2024-01-19 LAB — CMP (CANCER CENTER ONLY)
ALT: 18 U/L (ref 0–44)
AST: 26 U/L (ref 15–41)
Albumin: 4.4 g/dL (ref 3.5–5.0)
Alkaline Phosphatase: 74 U/L (ref 38–126)
Anion gap: 11 (ref 5–15)
BUN: 8 mg/dL (ref 6–20)
CO2: 26 mmol/L (ref 22–32)
Calcium: 9.3 mg/dL (ref 8.9–10.3)
Chloride: 102 mmol/L (ref 98–111)
Creatinine: 0.94 mg/dL (ref 0.44–1.00)
GFR, Estimated: >60 mL/min
Glucose, Bld: 92 mg/dL (ref 70–99)
Potassium: 4.1 mmol/L (ref 3.5–5.1)
Sodium: 139 mmol/L (ref 135–145)
Total Bilirubin: 0.5 mg/dL (ref 0.0–1.2)
Total Protein: 7.1 g/dL (ref 6.5–8.1)

## 2024-01-19 NOTE — Progress Notes (Signed)
 " Hematology and Oncology Follow Up Visit  Chastity Noland [id number] [birth year] 49 y.o. 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 Waldroup is here today for follow-up.  She still has a little bit of depression.  She really did not do much over the holidays.  She says that she sleeps most of the time when she is not working.  I just hate that she is not all that active.  She is still working.  I think she is quite busy at work.  She does not complain of any pain with the right breast.  I know that when we last saw her, she was having some issues with pain.  She has had no fever.  She has had no cough or shortness of breath.  She has had no nausea or vomiting.  There is been no change in bowel or bladder habits.  She has had no bleeding.  I think she is still smoking a little bit.  There has been no leg swelling.  Her last mammogram was back in May 2025.  Overall, I would say that her performance status is probably ECOG 1.     Medications:  Allergies as of 01/19/2024   No Known Allergies      Medication List        Accurate as of January 19, 2024  4:45 PM. If you have any questions, ask your nurse or doctor.          citalopram  10 MG tablet Commonly known as: CELEXA  Take 1 tablet (10 mg total) by mouth daily.   gabapentin  300 MG capsule Commonly known as: NEURONTIN  Take 1 capsule (300 mg total) by mouth 3 (three) times daily.   hydrOXYzine  25 MG tablet Commonly known as: ATARAX  Take 1 tablet (25 mg total) by mouth daily as needed. Appt for further refills   hydrOXYzine  25 MG tablet Commonly known as: ATARAX  Take 1 tablet (25 mg total) by mouth daily as needed.   nicotine  21 mg/24hr patch Commonly known as: Nicoderm CQ  Place 1 patch (21 mg total) onto the  skin daily.   omeprazole  40 MG capsule Commonly known as: PRILOSEC Take 1 capsule (40 mg total) by mouth daily.   telmisartan  20 MG tablet Commonly known as: Micardis  Take 1 tablet (20 mg total) by mouth daily. *NEED APPT FOR FURTHER REFILLS.*   telmisartan  20 MG tablet Commonly known as: Micardis  Take 1 tablet (20 mg total) by mouth daily.   Ventolin  HFA 108 (90 Base) MCG/ACT inhaler Generic drug: albuterol  Inhale 1-2 puffs into the lungs every 6 (six) hours as needed for wheezing or shortness of breath.        Allergies: No Known Allergies  Past Medical History, Surgical history, Social history, and Family History were reviewed and updated.  Review of Systems:  Review of Systems  Constitutional:  Positive for malaise/fatigue.  HENT: Negative.    Eyes: Negative.   Respiratory: Negative.    Cardiovascular: Negative.   Gastrointestinal: Negative.   Genitourinary: Negative.   Musculoskeletal: Negative.   Skin: Negative.   Neurological: Negative.   Endo/Heme/Allergies: Negative.   Psychiatric/Behavioral:  Positive for depression.      Physical Exam:  weight is 152 lb (68.9 kg). Her oral temperature is 98.5 F (36.9  C). Her blood pressure is 125/88 and her pulse is 73. Her respiration is 16 and oxygen saturation is 100%.   Wt Readings from Last 3 Encounters:  01/19/24 152 lb (68.9 kg)  12/03/23 143 lb (64.9 kg)  10/06/23 148 lb (67.1 kg)   Physical Exam Vitals reviewed.  Constitutional:      Comments: Her breast exam shows left breast with no masses, edema or erythema.  There is no left axillary adenopathy.  Her right breast shows the lumpectomy at about the 5 o'clock position.  There is tenderness at the lumpectomy site.  There is no mass at the lumpectomy site.  There is no right axillary adenopathy.  HENT:     Head: Normocephalic and atraumatic.  Eyes:     Pupils: Pupils are equal, round, and reactive to light.  Cardiovascular:     Rate and Rhythm: Normal  rate and regular rhythm.     Heart sounds: Normal heart sounds.  Pulmonary:     Effort: Pulmonary effort is normal.     Breath sounds: Normal breath sounds.  Abdominal:     General: Bowel sounds are normal.     Palpations: Abdomen is soft.  Musculoskeletal:        General: No tenderness or deformity. Normal range of motion.     Cervical back: Normal range of motion.     Comments: She does have a little bit of tenderness and numbness to palpation of the right upper arm.  Lymphadenopathy:     Cervical: No cervical adenopathy.  Skin:    General: Skin is warm and dry.     Findings: No erythema or rash.  Neurological:     Mental Status: She is alert and oriented to person, place, and time.  Psychiatric:        Behavior: Behavior normal.        Thought Content: Thought content normal.        Judgment: Judgment normal.      Lab Results  Component Value Date   WBC 6.0 01/19/2024   HGB 13.5 01/19/2024   HCT 39.4 01/19/2024   MCV 96.1 01/19/2024   PLT 254 01/19/2024   No results found for: FERRITIN, IRON, TIBC, UIBC, IRONPCTSAT Lab Results  Component Value Date   RBC 4.10 01/19/2024   No results found for: KPAFRELGTCHN, LAMBDASER, KAPLAMBRATIO No results found for: IGGSERUM, IGA, IGMSERUM No results found for: STEPHANY RINGS, A1GS, A2GS, EARLA JOANNIE DOC VICK, SPEI   Chemistry      Component Value Date/Time   NA 139 01/19/2024 1517   K 4.1 01/19/2024 1517   CL 102 01/19/2024 1517   CO2 26 01/19/2024 1517   BUN 8 01/19/2024 1517   CREATININE 0.94 01/19/2024 1517      Component Value Date/Time   CALCIUM 9.3 01/19/2024 1517   ALKPHOS 74 01/19/2024 1517   AST 26 01/19/2024 1517   ALT 18 01/19/2024 1517   BILITOT 0.5 01/19/2024 1517       Impression and Plan: Ms. Aguilera is a very charming 49 yo premenopausal African American female with history of early stage - stage Ia - breast cancer, ER/PR + and HER2 -.    She was on tamoxifen  for 5 years.  Everything is going well.  She is now off the tamoxifen .  I think she stopped tamoxifen  back in June 2025.  For right now, we will plan to get her back to see us  in about 4 months.  She will be due  for a mammogram.   Maude JONELLE Crease, MD 1/12/20264:45 PM  "

## 2024-05-17 ENCOUNTER — Inpatient Hospital Stay

## 2024-05-17 ENCOUNTER — Inpatient Hospital Stay: Admitting: Medical Oncology
